# Patient Record
Sex: Male | Born: 1940 | Race: White | Hispanic: No | Marital: Married | Smoking: Never smoker
Health system: Southern US, Community
[De-identification: ages and names within clinical notes are randomized; demographics above are authoritative.]

## PROBLEM LIST (undated history)

## (undated) DIAGNOSIS — I10 Essential (primary) hypertension: Secondary | ICD-10-CM

## (undated) DIAGNOSIS — C439 Malignant melanoma of skin, unspecified: Secondary | ICD-10-CM

## (undated) DIAGNOSIS — E785 Hyperlipidemia, unspecified: Secondary | ICD-10-CM

## (undated) HISTORY — DX: Essential (primary) hypertension: I10

## (undated) HISTORY — PX: OTHER SURGICAL HISTORY: SHX169

## (undated) HISTORY — DX: Hyperlipidemia, unspecified: E78.5

---

## 2020-11-10 ENCOUNTER — Other Ambulatory Visit: Payer: Self-pay

## 2020-11-10 ENCOUNTER — Encounter (HOSPITAL_COMMUNITY): Payer: Self-pay

## 2020-11-10 ENCOUNTER — Emergency Department (HOSPITAL_COMMUNITY)
Admission: EM | Admit: 2020-11-10 | Discharge: 2020-11-10 | Disposition: A | Discharge status: Home / Self Care | Payer: Medicare Other | Attending: Emergency Medicine | Admitting: Emergency Medicine

## 2020-11-10 DIAGNOSIS — R030 Elevated blood-pressure reading, without diagnosis of hypertension: Secondary | ICD-10-CM | POA: Insufficient documentation

## 2020-11-10 DIAGNOSIS — Z5321 Procedure and treatment not carried out due to patient leaving prior to being seen by health care provider: Secondary | ICD-10-CM | POA: Insufficient documentation

## 2020-11-10 HISTORY — DX: Malignant melanoma of skin, unspecified: C43.9

## 2020-11-10 NOTE — ED Triage Notes (Signed)
Pt reports having a 99991111 systolic blood pressure prior to arrival and a minor headache. BP in triage was 162/88. Pt sts that's a much better reading and requests to follow up with primary physician. Left from triage.

## 2021-01-22 ENCOUNTER — Telehealth: Payer: Self-pay | Admitting: Oncology

## 2021-01-22 NOTE — Telephone Encounter (Signed)
Scheduled appt per 11/22 referral. Pt aware.

## 2021-02-13 ENCOUNTER — Other Ambulatory Visit: Payer: Self-pay

## 2021-02-13 ENCOUNTER — Inpatient Hospital Stay: Payer: Medicare Other | Attending: Oncology | Admitting: Oncology

## 2021-02-13 ENCOUNTER — Ambulatory Visit: Payer: Medicare Other | Admitting: Oncology

## 2021-02-13 VITALS — BP 130/61 | HR 65 | Temp 98.1°F | Resp 16 | Ht 67.0 in | Wt 171.0 lb

## 2021-02-13 DIAGNOSIS — C439 Malignant melanoma of skin, unspecified: Secondary | ICD-10-CM | POA: Insufficient documentation

## 2021-02-13 DIAGNOSIS — I10 Essential (primary) hypertension: Secondary | ICD-10-CM | POA: Insufficient documentation

## 2021-02-13 NOTE — Progress Notes (Signed)
Reason for the request:    Melanoma  HPI: I was asked by Dr. Renda Rolls  to evaluate Brandon Padilla for the evaluation of melanoma.  This 80 year old man relocated to this area recently from [facility].  He is a rather healthy gentleman with history of hypertension and recurrent skin cancer.  He had multiple Mohs surgery in the past.  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  He underwent localized excision and sentinel lymph node sampling and found to have stage II disease without any evidence of lymph node involvement at that time.  He has been on active surveillance since that time including full dermatology follow-up.  He relocated to this area to be closer to family and is establishing care.  He denies any recent complaints at this time.  He denies any constitutional symptoms or weight loss.  He does not report any headaches, blurry vision, syncope or seizures. Does not report any fevers, chills or sweats.  Does not report any cough, wheezing or hemoptysis.  Does not report any chest pain, palpitation, orthopnea or leg edema.  Does not report any nausea, vomiting or abdominal pain.  Does not report any constipation or diarrhea.  Does not report any skeletal complaints.    Does not report frequency, urgency or hematuria.  Does not report any skin rashes or lesions. Does not report any heat or cold intolerance.  Does not report any lymphadenopathy or petechiae.  Does not report any anxiety or depression.  Remaining review of systems is negative.     Past Medical History:  Diagnosis Date   Malignant melanoma ([place])   Hypertension    Social History   Socioeconomic History   Marital status: Married    Spouse name: Not on file   Number of children: Not on file   Years of education: Not on file   Highest education level: Not on file  Occupational History   Not on file  Tobacco Use   Smoking status: Never   Smokeless tobacco: Never  Substance and Sexual Activity   Alcohol  use: Not on file   Drug use: Not on file   Sexual activity: Not on file  Other Topics Concern   Not on file  Social History Narrative   Not on file   Social Determinants of Health   Financial Resource Strain: Not on file  Food Insecurity: Not on file  Transportation Needs: Not on file  Physical Activity: Not on file  Stress: Not on file  Social Connections: Not on file  Intimate Partner Violence: Not on file  :  Pertinent items are noted in HPI.  Exam: Blood pressure 130/61, pulse 65, temperature 98.1 F (36.7 C), temperature source Temporal, resp. rate 16, height _0  (1.702 m), weight 171 lb (77.6 kg), SpO2 99 %. ECOG 1 General appearance: alert and cooperative appeared without distress. Head: atraumatic without any abnormalities. Eyes: conjunctivae/corneas clear. PERRL.  Sclera anicteric. Throat: lips, mucosa, and tongue normal; without oral thrush or ulcers. Resp: clear to auscultation bilaterally without rhonchi, wheezes or dullness to percussion. Cardio: regular rate and rhythm, S1, S2 normal, no murmur, click, rub or gallop GI: soft, non-tender; bowel sounds normal; no masses,  no organomegaly Skin: Well-healed scar noted on his right forearm Lymph nodes: Cervical, supraclavicular, and axillary nodes normal. Neurologic: Grossly normal without any motor, sensory or deep tendon reflexes. Musculoskeletal: No joint deformity or effusion.    Assessment and Plan:   80 year old with:  1.  History of melanoma of the forearm diagnosed in in 2020.  He was found to have stage II disease after local wide excision and negative lymph node sampling.  The natural course of this disease was reviewed at this time and treatment options were reviewed.  Relapse rate were also assessed at this time.  Oral targeted therapy for BRAF if he harbors the appropriate mutation versus immunotherapy remains the mainstay for treatment of advanced melanoma.  Single agent versus immunotherapy  doublet would be considered at that time.  At this time, high risk of relapse remains low and I recommended to repeat imaging studies with 1 last time and suspend any imaging studies moving forward.  We will update his labs and physical examination in 6 months.  2.  Follow-up: Will be in 6 months after repeat evaluation.   45  minutes were dedicated to this visit. The time was spent on reviewing disease status, risk of relapse of assessment, discussing treatment options,  and answering questions regarding future plan.     A copy of this consult has been forwarded to the requesting physician.

## 2021-02-21 ENCOUNTER — Telehealth: Payer: Self-pay | Admitting: Oncology

## 2021-02-21 NOTE — Telephone Encounter (Signed)
Sch per 12/15 los, pt aware °

## 2021-03-05 ENCOUNTER — Ambulatory Visit: Payer: Medicare Other | Admitting: Family Medicine

## 2021-04-14 ENCOUNTER — Encounter: Payer: Self-pay | Admitting: General Practice

## 2021-04-14 ENCOUNTER — Inpatient Hospital Stay: Payer: Medicare Other | Attending: Oncology | Admitting: General Practice

## 2021-04-14 NOTE — Progress Notes (Signed)
[facility]ual [facility] Note  Met with Brandon Padilla, who goes by "Brandon Padilla," for assistance reviewing documents at [facility]. Brandon Tijerina determined that he would like to specify more details, including special instructions, on his forms and has registered for a follow-up appointment at [facility] on 2/24 to complete the paperwork.   [place], [place], [facility] Pager [phone] Voicemail [phone]

## 2021-04-15 NOTE — Progress Notes (Signed)
[facility]ual Care Note  Inadvertently created a second note for this encounter. Please see same-day note.   [place], [place], [facility] Pager [phone] Voicemail [phone]

## 2021-04-25 ENCOUNTER — Other Ambulatory Visit: Payer: Self-pay

## 2021-04-25 ENCOUNTER — Inpatient Hospital Stay: Payer: Medicare Other | Admitting: Licensed Clinical Social Worker

## 2021-04-25 DIAGNOSIS — C439 Malignant melanoma of skin, unspecified: Secondary | ICD-10-CM

## 2021-04-25 NOTE — Progress Notes (Signed)
[facility] Healthcare Advance Directives °Clinical Social Work ° °Patient presented to [facility]  to review and complete healthcare advance directives.  Clinical Social Worker met with patient and pt's spouse.  The patient designated James Ronald Clark as their primary healthcare agent and Douglas Robert Udall as their secondary agent.  Patient also completed healthcare living will.   ° °Documents were notarized and copies made for patient/family. Clinical Social Worker will send documents to medical records to be scanned into patient's chart. °Clinical Social Worker encouraged patient/family to contact with any additional questions or concerns. ° ° °MICHELLE E ZAVALA, LCSW °Clinical Social Worker °[facility]      ° °

## 2021-04-28 ENCOUNTER — Ambulatory Visit (INDEPENDENT_AMBULATORY_CARE_PROVIDER_SITE_OTHER): Payer: Medicare Other | Admitting: Podiatry

## 2021-04-28 ENCOUNTER — Ambulatory Visit (INDEPENDENT_AMBULATORY_CARE_PROVIDER_SITE_OTHER): Payer: Medicare Other

## 2021-04-28 ENCOUNTER — Other Ambulatory Visit: Payer: Self-pay

## 2021-04-28 DIAGNOSIS — M21619 Bunion of unspecified foot: Secondary | ICD-10-CM

## 2021-04-28 DIAGNOSIS — M778 Other enthesopathies, not elsewhere classified: Secondary | ICD-10-CM | POA: Diagnosis not present

## 2021-04-28 DIAGNOSIS — M205X1 Other deformities of toe(s) (acquired), right foot: Secondary | ICD-10-CM | POA: Diagnosis not present

## 2021-04-28 DIAGNOSIS — M779 Enthesopathy, unspecified: Secondary | ICD-10-CM | POA: Diagnosis not present

## 2021-04-28 DIAGNOSIS — M79671 Pain in right foot: Secondary | ICD-10-CM

## 2021-04-29 ENCOUNTER — Other Ambulatory Visit: Payer: Self-pay | Admitting: Podiatry

## 2021-04-29 DIAGNOSIS — M778 Other enthesopathies, not elsewhere classified: Secondary | ICD-10-CM

## 2021-05-04 NOTE — Progress Notes (Signed)
Subjective:   Patient ID: Brandon Padilla, male   DOB: 81 y.o.   MRN: [id number]   HPI 81 year old male presents the office today for concerns of his right foot how the big toe has been turning to the side.  He does like to walk about 3 miles a day has not allowing him to walk as much.  Does not cause significant pain.  He previously did have some swelling.  No recent treatment.  No recent injury.  He has no other concerns.   Review of Systems  All other systems reviewed and are negative.  Past Medical History:  Diagnosis Date   Malignant melanoma ([place])     No past surgical history on file.   Current Outpatient Medications:    atorvastatin (LIPITOR) 20 MG tablet, Take 20 mg by mouth at bedtime., Disp: , Rfl:    lisinopril-hydrochlorothiazide (ZESTORETIC) 10-12.5 MG tablet, Take 1 tablet by mouth daily., Disp: , Rfl:    niacinamide 500 MG tablet, Take 500 mg by mouth 2 (two) times daily., Disp: , Rfl:   No Known Allergies        Objective:  Physical Exam  General: AAO x3, NAD  Dermatological: Skin is warm, dry and supple bilateral.  There are no open sores, no preulcerative lesions, no rash or signs of infection present.  Vascular: Dorsalis Pedis artery and Posterior Tibial artery pedal pulses are 2/4 bilateral with immedate capillary fill time. There is no pain with calf compression, swelling, warmth, erythema.   Neruologic: Grossly intact via light touch bilateral.   Musculoskeletal: Moderate bunion is present on the right foot there is decreased range of motion of the first MPJ with dorsal spurring present at the first lipid today.  There is mild edema present to the joint there is no erythema or warmth.  No specific area of tenderness otherwise.  Muscular strength 5/5 in all groups tested bilateral.  Gait: Unassisted, Nonantalgic.       Assessment:   Right foot first MPJ arthritis, bunion     Plan:  -Treatment options discussed including all alternatives,  risks, and complications -Etiology of symptoms were discussed -X-rays were obtained and reviewed with the patient.  There is no acute fracture.  Bunion is present there is arthritic changes present the first MPJ with narrowing of the joint spaces as well as dorsal spurring. -Discussed with conservative as well as surgical options.  Conservatively discussed wearing shoes with stiffer sole and we can also consider doing orthotics.  Topical anti-inflammatories or steroid injection if needed.  Surgically would need to have a first MTPJ arthrodesis.  He is not interested in that at this time.  Trula Slade DPM

## 2021-07-11 ENCOUNTER — Emergency Department (HOSPITAL_COMMUNITY)
Admission: EM | Admit: 2021-07-11 | Discharge: 2021-07-11 | Disposition: A | Discharge status: Home / Self Care | Payer: Medicare Other | Attending: Emergency Medicine | Admitting: Emergency Medicine

## 2021-07-11 ENCOUNTER — Encounter (HOSPITAL_COMMUNITY): Payer: Self-pay

## 2021-07-11 ENCOUNTER — Other Ambulatory Visit: Payer: Self-pay

## 2021-07-11 DIAGNOSIS — H6122 Impacted cerumen, left ear: Secondary | ICD-10-CM | POA: Insufficient documentation

## 2021-07-11 DIAGNOSIS — H9202 Otalgia, left ear: Secondary | ICD-10-CM | POA: Diagnosis present

## 2021-07-11 MED ORDER — OFLOXACIN 0.3 % OT SOLN: 5.0000 [drp] | Freq: Two times a day (BID) | OTIC | 0 refills | Status: DC

## 2021-07-11 NOTE — Discharge Instructions (Addendum)
Please follow-up with ENT, call Dr. Iona Beard office to request appointment.  Recommend taking the eardrops as prescribed.  Come back to ER for increasing pain, fever or other new concerning symptom. ?

## 2021-07-11 NOTE — ED Provider Notes (Signed)
?[facility] DEPT ?Provider Note ? ? ?CSN: [id number] ?Arrival date & time: 07/11/21  0857 ? ?  ? ?History ? ?Chief Complaint  ?Patient presents with  ? Ear Pain  ? ? ?Brandon Padilla is a 81 y.o. male.  Presented to the emergency department due to concern for ruptured eardrum, impacted cerumen.  Patient reports that he went to his primary care doctor yesterday to have his left ear cleared.  He was concerned about decreased hearing, cerumen impaction.  States that while he was at the PCP office and RN was performing irrigation to attempt to clear out the impaction when he felt sudden onset of pain in his left ear, he was told that his eardrum ruptured and he was referred to ear nose and throat for further evaluation.  Reports ENT cannot see him until July.  States that pain is minimal at present, concerned about the status of his ear.  No fevers or chills. ? ?HPI ? ?  ? ?Home Medications ?Prior to Admission medications   ?Medication Sig Start Date End Date Taking? Authorizing Provider  ?ofloxacin (FLOXIN) 0.3 % OTIC solution Place 5 drops into the left ear 2 (two) times daily. 07/11/21  Yes Lucrezia Starch, MD  ?atorvastatin (LIPITOR) 20 MG tablet Take 20 mg by mouth at bedtime. 09/25/20   [provider]  ?lisinopril-hydrochlorothiazide (ZESTORETIC) 10-12.5 MG tablet Take 1 tablet by mouth daily.    [provider]  ?niacinamide 500 MG tablet Take 500 mg by mouth 2 (two) times daily. 10/17/20   [provider]  ?   ? ?Allergies    ?Patient has no known allergies.   ? ?Review of Systems   ?Review of Systems  ?HENT:  Positive for ear pain and hearing loss. Negative for ear discharge.   ?All other systems reviewed and are negative. ? ?Physical Exam ?Updated Vital Signs ?BP (!) 145/71 (BP Location: Left Arm)   Pulse 76   Temp 98 ?F (36.7 ?C) (Oral)   Resp 18   SpO2 97%  ?Physical Exam ?Vitals and nursing note reviewed.  ?Constitutional:   ?   General: He is  not in acute distress. ?   Appearance: He is well-developed.  ?HENT:  ?   Head: Normocephalic and atraumatic.  ?   Right Ear: Tympanic membrane, ear canal and external ear normal. There is no impacted cerumen.  ?   Ears:  ?   Comments: Left ear has significant cerumen impaction, unable to visualize TM readily due to the impaction ?Eyes:  ?   Conjunctiva/sclera: Conjunctivae normal.  ?Cardiovascular:  ?   Rate and Rhythm: Normal rate.  ?   Pulses: Normal pulses.  ?Pulmonary:  ?   Effort: Pulmonary effort is normal. No respiratory distress.  ?Abdominal:  ?   Palpations: Abdomen is soft.  ?   Tenderness: There is no abdominal tenderness.  ?Musculoskeletal:     ?   General: No swelling.  ?   Cervical back: Neck supple.  ?Skin: ?   General: Skin is warm and dry.  ?   Capillary Refill: Capillary refill takes less than 2 seconds.  ?Neurological:  ?   Mental Status: He is alert.  ?Psychiatric:     ?   Mood and Affect: Mood normal.  ? ? ?ED Results / Procedures / Treatments   ?Labs ?(all labs ordered are listed, but only abnormal results are displayed) ?Labs Reviewed - No data to display ? ?EKG ?None ? ?Radiology ?  No results found. ? ?Procedures ?Marland KitchenEar Cerumen Removal ? ?Date/Time: 07/11/2021 1:09 PM ?Performed by: Lucrezia Starch, MD ?Authorized by: Lucrezia Starch, MD  ? ?Consent:  ?  Consent obtained:  Verbal ?  Risks, benefits, and alternatives were discussed: yes   ?  Risks discussed:  Bleeding, dizziness, incomplete removal, infection, pain and TM perforation ?  Alternatives discussed:  No treatment, delayed treatment, alternative treatment, observation and referral ?Universal protocol:  ?  Immediately prior to procedure, a time out was called: yes   ?  Patient identity confirmed:  Verbally with patient ?Procedure details:  ?  Location:  L ear ?  Procedure type: curette   ?  Procedure outcomes: cerumen removed   ?Post-procedure details:  ?  Inspection:  Some cerumen remaining ?  Procedure completion:  Tolerated  well, no immediate complications  ? ? ?Medications Ordered in ED ?Medications - No data to display ? ?ED Course/ Medical Decision Making/ A&P ?  ?                        ?Medical Decision Making ?Risk ?Prescription drug management. ? ? ?81 year old male presented to ER for left ear evaluation.  Was at PCP yesterday due to concern for ear infection and while irrigating the ear he had sudden onset of pain in was told that his TM may have ruptured.  He was referred to ENT but does not have follow-up until a couple months.  Successfully removed significant amount of cerumen from his left ear.  He still however has a significant amount of cerumen in the left ear and still unable to readily visualize the tympanic membrane.  Will give Rx for otic drops in case of possible ruptured TM from yesterday.  Encouraged patient to call ENT office to request closer appointment.  Provided information for ENT on-call today.  Reviewed return precautions and discharged. ? ? ? ? ? ? ? ?Final Clinical Impression(s) / ED Diagnoses ?Final diagnoses:  ?Impacted cerumen of left ear  ? ? ?Rx / DC Orders ?ED Discharge Orders   ? ?      Ordered  ?  ofloxacin (FLOXIN) 0.3 % OTIC solution  2 times daily       ? 07/11/21 1006  ? ?  ?  ? ?  ? ? ?  ?Lucrezia Starch, MD ?07/11/21 1309 ? ?

## 2021-07-11 NOTE — ED Triage Notes (Signed)
Pt reports he went to PCP to have ear impaction cleared in left ear yesterday, but his ear drum ruptured. Pt is attempting to get hearing aids and wants to make sure there is not too much damage to his ear. ?

## 2021-07-22 ENCOUNTER — Ambulatory Visit (INDEPENDENT_AMBULATORY_CARE_PROVIDER_SITE_OTHER): Payer: Medicare Other | Admitting: Nurse Practitioner

## 2021-07-22 ENCOUNTER — Encounter: Payer: Self-pay | Admitting: Nurse Practitioner

## 2021-07-22 VITALS — BP 122/90 | HR 63 | Temp 98.0°F | Ht 67.0 in | Wt 174.0 lb

## 2021-07-22 DIAGNOSIS — Z6827 Body mass index (BMI) 27.0-27.9, adult: Secondary | ICD-10-CM

## 2021-07-22 DIAGNOSIS — H7292 Unspecified perforation of tympanic membrane, left ear: Secondary | ICD-10-CM

## 2021-07-22 DIAGNOSIS — C439 Malignant melanoma of skin, unspecified: Secondary | ICD-10-CM | POA: Diagnosis not present

## 2021-07-22 DIAGNOSIS — Z7689 Persons encountering health services in other specified circumstances: Secondary | ICD-10-CM

## 2021-07-22 DIAGNOSIS — I10 Essential (primary) hypertension: Secondary | ICD-10-CM

## 2021-07-22 LAB — POCT URINALYSIS DIPSTICK
Bilirubin, UA: NEGATIVE
Blood, UA: NEGATIVE
Glucose, UA: NEGATIVE
Ketones, UA: NEGATIVE
Leukocytes, UA: NEGATIVE
Nitrite, UA: NEGATIVE
Protein, UA: NEGATIVE
Spec Grav, UA: 1.02 (ref 1.010–1.025)
Urobilinogen, UA: 1 E.U./dL
pH, UA: 7 (ref 5.0–8.0)

## 2021-07-22 NOTE — Progress Notes (Signed)
I,Victoria T Hamilton,acting as a Education administrator for Minette Brine, FNP.,have documented all relevant documentation on the behalf of Minette Brine, FNP,as directed by  Minette Brine, FNP while in the presence of Minette Brine, [place].  This visit occurred during the SARS-CoV-2 public health emergency.  Safety protocols were in place, including screening questions prior to the visit, additional usage of staff PPE, and extensive cleaning of exam room while observing appropriate contact time as indicated for disinfecting solutions.  Subjective:     Patient ID: Brandon Padilla , male    DOB: Apr 09, 1940 , 80 y.o.   MRN: [id number]   Chief Complaint  Patient presents with   New Patient (Initial Visit)    HPI  Pt presents today to establish care. He relocated from [facility] less than one year.  His partner and he decided to relocate her to [place], after they arrived his partner had back pain and was diagnosed with metastatic prostate cancer. 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 No children.   PMH - hypertension (6 months taking lisinopril), cholesterol (lipitor - 4-5 years).   Had labs done at [facility] 2 weeks ago.  He had been wearing hearing aids and stopped but recently has been having difficulty hearing, went to [employer]. His left ear had increase cerumen. During his ear flushing and ended with a perforated tympanic membrane and he could taste the irrigation solution. Went to [employer] , was able to remove some cerumen and unable to see TM. He has been having "popping' and his hearing is worse. When listening at a low volume can hear clearly. He had pain intially and a headache.   He used to walk 3 miles in the morning but not much recently due to his husband being treated for metastatic cancer.   When he initially relocated here he was having headaches and checked his blood  pressure at [facility] was 403 systolic. Since lisinopril has been effective and feels like well   He reports having tympanic membrane ruptured 2 weeks ago in left ear. Previous practice, [facility]. He went to his pcp 2 weeks ago, he was not pleased with care after the incident of his eat which he admits is partially his fault & the nurses fault.  He would like for his left ear to be looked at here today, wanting to know how it is healing. He reports having an appointment with ENT a week from this Friday coming.  He recently moved here from [place] , he has been here since September of 2022.  He had cbc and metabolic panel done 3 weeks ago.       Past Medical History:  Diagnosis Date   Malignant melanoma ([place])      Family History  Problem Relation Age of Onset   Hypertension Mother    Dementia Mother    Heart attack Father    Diabetes Brother    Prostate cancer Brother      Current Outpatient Medications:    atorvastatin (LIPITOR) 20 MG tablet, Take 20 mg by mouth at bedtime., Disp: , Rfl:    lisinopril-hydrochlorothiazide (ZESTORETIC) 10-12.5 MG tablet, Take 1 tablet by mouth daily., Disp: , Rfl:    niacinamide 500 MG tablet, Take 500 mg by mouth 2 (two) times daily., Disp: , Rfl:    ofloxacin (FLOXIN) 0.3 % OTIC solution, Place  5 drops into the left ear 2 (two) times daily. (Patient not taking: Reported on 07/22/2021), Disp: 5 mL, Rfl: 0   No Known Allergies   Review of Systems  Constitutional: Negative.   HENT:  Positive for hearing loss (left ear). Negative for tinnitus.   Eyes: Negative.   Cardiovascular: Negative.   Gastrointestinal: Negative.   Genitourinary: Negative.   Musculoskeletal: Negative.   Skin: Negative.   Allergic/Immunologic: Negative.   Hematological: Negative.      Today's Vitals   07/22/21 1412  BP: 122/90  Pulse: 63  Temp: 98 F (36.7 C)  SpO2: 97%  Weight: 174 lb (78.9 kg)  Height: '5\' 7"'$  (1.702 m)  PainSc: 0-No pain   Body mass  index is 27.25 kg/m.  Wt Readings from Last 3 Encounters:  07/22/21 174 lb (78.9 kg)  02/13/21 171 lb (77.6 kg)  11/10/20 182 lb (82.6 kg)    Objective:  Physical Exam Vitals reviewed.  Constitutional:      General: He is not in acute distress.    Appearance: Normal appearance. He is obese.  HENT:     Head: Normocephalic.  Cardiovascular:     Rate and Rhythm: Normal rate and regular rhythm.     Pulses: Normal pulses.     Heart sounds: Normal heart sounds. No murmur heard. Pulmonary:     Effort: Pulmonary effort is normal. No respiratory distress.     Breath sounds: Normal breath sounds. No wheezing.  Musculoskeletal:        General: Normal range of motion.  Skin:    General: Skin is warm and dry.     Capillary Refill: Capillary refill takes less than 2 seconds.  Neurological:     General: No focal deficit present.     Mental Status: He is alert and oriented to person, place, and time.  Psychiatric:        Mood and Affect: Mood normal.        Behavior: Behavior normal.        Thought Content: Thought content normal.        Judgment: Judgment normal.         Assessment And Plan:     1. Hypertension, unspecified type Comments: Blood pressure is fairly controlled, continue current medications - POCT Urinalysis Dipstick (81002)  2. Perforation of left tympanic membrane Comments: Dried blood seen to ear canal, he is to see ENT one week from Friday. Advised to not insert anything to ear.   3. Melanoma of skin ([place]) Comments: Has appt at end of next month for 6 month f/u  4. Body mass index (BMI) of 27.0 to 27.9 in adult  5. Encounter to establish care with new doctor     Patient was given opportunity to ask questions. Patient verbalized understanding of the plan and was able to repeat key elements of the plan. All questions were answered to their satisfaction.  Minette Brine, FNP   I, Minette Brine, FNP, have reviewed all documentation for this visit. The  documentation on 07/22/21 for the exam, diagnosis, procedures, and orders are all accurate and complete.   IF YOU HAVE BEEN REFERRED TO A SPECIALIST, IT MAY TAKE 1-2 WEEKS TO SCHEDULE/PROCESS THE REFERRAL. IF YOU HAVE NOT HEARD FROM US/SPECIALIST IN TWO WEEKS, PLEASE GIVE [place] A CALL AT [phone].   THE PATIENT IS ENCOURAGED TO PRACTICE SOCIAL DISTANCING DUE TO THE COVID-19 PANDEMIC.

## 2021-08-08 ENCOUNTER — Inpatient Hospital Stay: Payer: Medicare Other | Attending: Oncology

## 2021-08-08 ENCOUNTER — Other Ambulatory Visit: Payer: Self-pay

## 2021-08-08 DIAGNOSIS — Z85820 Personal history of malignant melanoma of skin: Secondary | ICD-10-CM | POA: Diagnosis present

## 2021-08-08 DIAGNOSIS — C439 Malignant melanoma of skin, unspecified: Secondary | ICD-10-CM

## 2021-08-08 LAB — CMP (CANCER CENTER ONLY)
ALT: 20 U/L (ref 0–44)
AST: 17 U/L (ref 15–41)
Albumin: 4.4 g/dL (ref 3.5–5.0)
Alkaline Phosphatase: 43 U/L (ref 38–126)
Anion gap: 6 (ref 5–15)
BUN: 17 mg/dL (ref 8–23)
CO2: 28 mmol/L (ref 22–32)
Calcium: 9.9 mg/dL (ref 8.9–10.3)
Chloride: 105 mmol/L (ref 98–111)
Creatinine: 0.94 mg/dL (ref 0.61–1.24)
GFR, Estimated: >60 mL/min (ref >60)
Glucose, Bld: 108 mg/dL — ABNORMAL HIGH (ref 70–99)
Potassium: 4.6 mmol/L (ref 3.5–5.1)
Sodium: 139 mmol/L (ref 135–145)
Total Bilirubin: 1 mg/dL (ref 0.3–1.2)
Total Protein: 7.2 g/dL (ref 6.5–8.1)

## 2021-08-08 LAB — CBC WITH DIFFERENTIAL (CANCER CENTER ONLY)
Abs Immature Granulocytes: 0.02 10*3/uL (ref 0.00–0.07)
Basophils Absolute: 0 10*3/uL (ref 0.0–0.1)
Basophils Relative: 1 %
Eosinophils Absolute: 0 10*3/uL (ref 0.0–0.5)
Eosinophils Relative: 0 %
HCT: 38.9 % — ABNORMAL LOW (ref 39.0–52.0)
Hemoglobin: 13.8 g/dL (ref 13.0–17.0)
Immature Granulocytes: 0 %
Lymphocytes Relative: 24 %
Lymphs Abs: 1.3 10*3/uL (ref 0.7–4.0)
MCH: 32.5 pg (ref 26.0–34.0)
MCHC: 35.5 g/dL (ref 30.0–36.0)
MCV: 91.5 fL (ref 80.0–100.0)
Monocytes Absolute: 0.4 10*3/uL (ref 0.1–1.0)
Monocytes Relative: 8 %
Neutro Abs: 3.6 10*3/uL (ref 1.7–7.7)
Neutrophils Relative %: 67 %
Platelet Count: 209 10*3/uL (ref 150–400)
RBC: 4.25 MIL/uL (ref 4.22–5.81)
RDW: 11.8 % (ref 11.5–15.5)
WBC Count: 5.4 10*3/uL (ref 4.0–10.5)
nRBC: 0 % (ref 0.0–0.2)

## 2021-08-12 ENCOUNTER — Ambulatory Visit (HOSPITAL_COMMUNITY)
Admission: RE | Admit: 2021-08-12 | Discharge: 2021-08-12 | Disposition: A | Discharge status: Home / Self Care | Payer: Medicare Other | Source: Ambulatory Visit | Attending: Oncology | Admitting: Oncology

## 2021-08-12 DIAGNOSIS — C439 Malignant melanoma of skin, unspecified: Secondary | ICD-10-CM | POA: Insufficient documentation

## 2021-08-12 MED ORDER — IOHEXOL 300 MG/ML  SOLN
100.0000 mL | Freq: Once | INTRAMUSCULAR | Status: AC | PRN
  Administered 2021-08-12: 100 mL via INTRAVENOUS

## 2021-08-12 MED ORDER — SODIUM CHLORIDE (PF) 0.9 % IJ SOLN
INTRAMUSCULAR | Status: AC
  Filled 2021-08-12: qty 50

## 2021-08-15 ENCOUNTER — Inpatient Hospital Stay (HOSPITAL_BASED_OUTPATIENT_CLINIC_OR_DEPARTMENT_OTHER): Payer: Medicare Other | Admitting: Oncology

## 2021-08-15 ENCOUNTER — Other Ambulatory Visit: Payer: Self-pay

## 2021-08-15 VITALS — BP 131/65 | HR 66 | Temp 97.8°F | Resp 16 | Ht 67.0 in | Wt 174.3 lb

## 2021-08-15 DIAGNOSIS — C439 Malignant melanoma of skin, unspecified: Secondary | ICD-10-CM | POA: Diagnosis not present

## 2021-08-15 DIAGNOSIS — Z85820 Personal history of malignant melanoma of skin: Secondary | ICD-10-CM | POA: Diagnosis not present

## 2021-08-15 NOTE — Progress Notes (Signed)
Hematology and Oncology Follow Up Visit  Brandon Padilla [id number] 1940-09-17 81 y.o. 08/15/2021 11:44 AM Brandon Padilla, FNPNo ref. provider found   Principle Diagnosis: 81 year old man with stage II melanoma of the forearm diagnosed in 2020.  He is currently on active surveillance.   Prior Therapy: He is status post wide excision and sentinel lymph node sampling completed in 2020 while living in [place].  Current therapy: Active surveillance.  Interim History: [name] returns today for a follow-up visit.  Since last visit, he reports feeling well without any major complaints.  He denies any nausea, vomiting or abdominal pain.  He denies any skin rashes or lesions.  His performance status quality of life remained excellent.    Medications: I have reviewed the patient's current medications.  Current Outpatient Medications  Medication Sig Dispense Refill   atorvastatin (LIPITOR) 20 MG tablet Take 20 mg by mouth at bedtime.     lisinopril-hydrochlorothiazide (ZESTORETIC) 10-12.5 MG tablet Take 1 tablet by mouth daily.     niacinamide 500 MG tablet Take 500 mg by mouth 2 (two) times daily.     ofloxacin (FLOXIN) 0.3 % OTIC solution Place 5 drops into the left ear 2 (two) times daily. (Patient not taking: Reported on 07/22/2021) 5 mL 0   No current facility-administered medications for this visit.     Allergies: No Known Allergies    Physical Exam: Blood pressure 131/65, pulse 66, temperature 97.8 F (36.6 C), temperature source Temporal, resp. rate 16, height '5\' 7"'$  (1.702 m), weight 174 lb 4.8 oz (79.1 kg), SpO2 100 %.  ECOG: 0    General appearance: Comfortable appearing without any discomfort Head: Normocephalic without any trauma Oropharynx: Mucous membranes are moist and pink without any thrush or ulcers. Eyes: Pupils are equal and round reactive to light. Lymph nodes: No cervical, supraclavicular, inguinal or axillary lymphadenopathy.   Heart:regular rate and  rhythm.  S1 and S2 without leg edema. Lung: Clear without any rhonchi or wheezes.  No dullness to percussion. Abdomin: Soft, nontender, nondistended with good bowel sounds.  No hepatosplenomegaly. Musculoskeletal: No joint deformity or effusion.  Full range of motion noted. Neurological: No deficits noted on motor, sensory and deep tendon reflex exam. Skin: No petechial rash or dryness.  Appeared moist.      Lab Results: Lab Results  Component Value Date   WBC 5.4 08/08/2021   HGB 13.8 08/08/2021   HCT 38.9 (L) 08/08/2021   MCV 91.5 08/08/2021   PLT 209 08/08/2021     Chemistry      Component Value Date/Time   NA 139 08/08/2021 1208   K 4.6 08/08/2021 1208   CL 105 08/08/2021 1208   CO2 28 08/08/2021 1208   BUN 17 08/08/2021 1208   CREATININE 0.94 08/08/2021 1208      Component Value Date/Time   CALCIUM 9.9 08/08/2021 1208   ALKPHOS 43 08/08/2021 1208   AST 17 08/08/2021 1208   ALT 20 08/08/2021 1208   BILITOT 1.0 08/08/2021 1208       Radiological Studies: IMPRESSION: 1. No specific findings of active malignancy in the chest, abdomen, or pelvis. 2. A triangular 4 by 3 mm nodule in the right middle lobe is highly likely to benign based on morphology and size, although Fleischner follow up criteria do not apply given the patient's history of malignancy. If clinically warranted, surveillance CT in 1 years time could be utilized; if patient has prior imaging workup which can be provided, we are happy  to compare to assess for stability. 3. Other imaging findings of potential clinical significance: Aortic Atherosclerosis (ICD10-I70.0). 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 Sigmoid colon diverticulosis. Prostatomegaly. Mild to moderate degrees of lower lumbar impingement.   Impression and Plan:    81 year old with:  1.  Stage II melanoma of the forearm diagnosed in in 2020.  He had completed wide  excision and sentinel lymph node sampling without any additional treatment.  The natural course of this disease was reviewed at this time.  CT scan obtained on August 12, 2021 was personally reviewed and showed no evidence of metastatic disease.  At this time, I see no indication for treatment unless he has relapsed disease.  We will continue to offer medical oncology surveillance we will obtain imaging studies in the future as needed.  2.  Dermatology surveillance: He is up-to-date and I recommended continuing indefinitely.    3.  Follow-up: He will return in 1 year for repeat evaluation.     30  minutes were spent on this encounter.  The time was dedicated to reviewing laboratory data, imaging studies, disease status update and outlining future plan of care discussion.    Zola Button, MD 6/16/202311:44 AM

## 2021-09-24 ENCOUNTER — Telehealth: Payer: Self-pay | Admitting: Oncology

## 2021-09-24 NOTE — Telephone Encounter (Signed)
Called patient regarding upcoming 2024 appointment, left a voicemail. 

## 2021-11-24 ENCOUNTER — Encounter: Payer: Self-pay | Admitting: Nurse Practitioner

## 2021-11-24 ENCOUNTER — Ambulatory Visit (INDEPENDENT_AMBULATORY_CARE_PROVIDER_SITE_OTHER): Payer: Medicare Other | Admitting: Nurse Practitioner

## 2021-11-24 VITALS — BP 122/68 | HR 63 | Temp 98.6°F | Ht 67.0 in | Wt 174.6 lb

## 2021-11-24 DIAGNOSIS — I10 Essential (primary) hypertension: Secondary | ICD-10-CM

## 2021-11-24 DIAGNOSIS — R351 Nocturia: Secondary | ICD-10-CM

## 2021-11-24 DIAGNOSIS — I700 Atherosclerosis of aorta: Secondary | ICD-10-CM | POA: Diagnosis not present

## 2021-11-24 DIAGNOSIS — R21 Rash and other nonspecific skin eruption: Secondary | ICD-10-CM | POA: Diagnosis not present

## 2021-11-24 DIAGNOSIS — C439 Malignant melanoma of skin, unspecified: Secondary | ICD-10-CM

## 2021-11-24 DIAGNOSIS — Z0000 Encounter for general adult medical examination without abnormal findings: Secondary | ICD-10-CM

## 2021-11-24 DIAGNOSIS — Z872 Personal history of diseases of the skin and subcutaneous tissue: Secondary | ICD-10-CM

## 2021-11-24 DIAGNOSIS — N401 Enlarged prostate with lower urinary tract symptoms: Secondary | ICD-10-CM

## 2021-11-24 DIAGNOSIS — E782 Mixed hyperlipidemia: Secondary | ICD-10-CM

## 2021-11-24 MED ORDER — AMLODIPINE BESYLATE 2.5 MG PO TABS: 2.5000 mg | ORAL_TABLET | Freq: Every day | ORAL | 2 refills | Status: DC

## 2021-11-24 MED ORDER — CLOBETASOL PROPIONATE 0.05 % EX CREA
1.0000 | TOPICAL_CREAM | Freq: Two times a day (BID) | CUTANEOUS | 2 refills | Status: AC
Start: 2021-11-24 — End: ?

## 2021-11-24 NOTE — Progress Notes (Signed)
Subjective:   Brandon Padilla is a 81 y.o. male who presents for an Initial Medicare Annual Wellness Visit.  He is now wearing bilateral hearing aids. He has been to an eye doctor had cataracts. Eats healthy most times. Do not eat large portions, typically will split his entree and that is enough for him. He has a CT of lung yearly to check for any residual from myeloma. The most recent one he had shows 4 mm nodule.   Left medial thigh with red eczema type rash using clobetasol   Review of Systems  Constitutional: Negative.   HENT: Negative.  Negative for hearing loss.   Eyes: Negative.   Respiratory: Negative.    Cardiovascular: Negative.   Gastrointestinal: Negative.   Genitourinary: Negative.   Musculoskeletal: Negative.   Skin: Negative.   Neurological: Negative.   Endo/Heme/Allergies: Negative.   Psychiatric/Behavioral: Negative.         Objective:    Today's Vitals   11/24/21 1443  BP: 122/68  Pulse: 63  Temp: 98.6 F (37 C)  TempSrc: Oral  Weight: 174 lb 9.6 oz (79.2 kg)  Height: _0  (1.702 m)   Body mass index is 27.35 kg/m. Physical Exam Vitals reviewed.  Constitutional:      General: He is not in acute distress.    Appearance: Normal appearance.  Eyes:     Pupils: Pupils are equal, round, and reactive to light.  Cardiovascular:     Rate and Rhythm: Normal rate and regular rhythm.     Pulses: Normal pulses.     Heart sounds: Normal heart sounds.  Pulmonary:     Effort: Pulmonary effort is normal. No respiratory distress.     Breath sounds: Normal breath sounds.  Musculoskeletal:        General: Normal range of motion.  Skin:    General: Skin is warm and dry.     Capillary Refill: Capillary refill takes less than 2 seconds.  Neurological:     General: No focal deficit present.     Mental Status: He is alert and oriented to person, place, and time.     Cranial Nerves: No cranial nerve deficit.  Psychiatric:        Mood and Affect: Mood  normal.        Behavior: Behavior normal.        Thought Content: Thought content normal.        Judgment: Judgment normal.        07/11/2021    9:19 AM 02/13/2021   11:09 AM 11/10/2020    8:01 PM  Advanced Directives  Does Patient Have a Medical Advance Directive? No Yes No  Type of Advance Directive  Living will   Would patient like information on creating a medical advance directive? No - Patient declined  No - Patient declined    Current Medications (verified) Outpatient Encounter Medications as of 11/24/2021  Medication Sig   atorvastatin (LIPITOR) 20 MG tablet Take 20 mg by mouth at bedtime.   lisinopril-hydrochlorothiazide (ZESTORETIC) 10-12.5 MG tablet Take 1 tablet by mouth daily.   niacinamide 500 MG tablet Take 500 mg by mouth 2 (two) times daily.   ofloxacin (FLOXIN) 0.3 % OTIC solution Place 5 drops into the left ear 2 (two) times daily. (Patient not taking: Reported on 07/22/2021)   No facility-administered encounter medications on file as of 11/24/2021.    Allergies (verified) Patient has no known allergies.   History: Past Medical History:  Diagnosis Date  Malignant melanoma ([place])    Past Surgical History:  Procedure Laterality Date   excision Right    excision of melanoma   left foot surgery Left    Family History  Problem Relation Age of Onset   Hypertension Mother    Dementia Mother    Heart attack Father    Diabetes Brother    Prostate cancer Brother    Social History   Socioeconomic History   Marital status: Married    Spouse name: Not on file   Number of children: Not on file   Years of education: Not on file   Highest education level: Not on file  Occupational History   Occupation: retired  Tobacco Use   Smoking status: Never   Smokeless tobacco: Never  Substance and Sexual Activity   Alcohol use: Yes    Comment: socially he drinks wine   Drug use: Not Currently   Sexual activity: Yes    Birth control/protection: None  Other  Topics Concern   Not on file  Social History Narrative   Not on file   Social Determinants of Health   Financial Resource Strain: Low Risk  (11/24/2021)   Overall Financial Resource Strain (CARDIA)    Difficulty of Paying Living Expenses: Not hard at all  Food Insecurity: No Food Insecurity (11/24/2021)   Hunger Vital Sign    Worried About Running Out of Food in the Last Year: Never true    [place] in the Last Year: Never true  Transportation Needs: No Transportation Needs (11/24/2021)   PRAPARE - Hydrologist (Medical): No    Lack of Transportation (Non-Medical): No  Physical Activity: Sufficiently Active (11/24/2021)   Exercise Vital Sign    Days of Exercise per Week: 7 days    Minutes of Exercise per Session: 120 min  Stress: No Stress Concern Present (11/24/2021)   [place]    Feeling of Stress : Not at all  Social Connections: Moderately Isolated (11/24/2021)   Social Connection and Isolation Panel [NHANES]    Frequency of Communication with Friends and Family: More than three times a week    Frequency of Social Gatherings with Friends and Family: Once a week    Attends Religious Services: Never    Marine scientist or Organizations: No    Attends Archivist Meetings: Never    Marital Status: Married    Tobacco Counseling Counseling given: Not Answered   Clinical Intake:                 Diabetic?no         Activities of Daily Living    11/24/2021    2:53 PM 07/22/2021    3:05 PM  In your present state of health, do you have any difficulty performing the following activities:  Hearing? 1 1  Vision? 0 0  Difficulty concentrating or making decisions? 0 0  Walking or climbing stairs? 0 0  Dressing or bathing? 0 0  Doing errands, shopping? 0 0  Preparing Food and eating ? N   Using the Toilet? N   In the past six months, have you  accidently leaked urine? N   Do you have problems with loss of bowel control? N   Managing your Medications? N   Managing your Finances? N   Housekeeping or managing your Housekeeping? N     Patient Care Team: Minette Brine, FNP  as PCP - General (General Practice)  Indicate any recent Medical Services you may have received from other than [facility] providers in the past year (date may be approximate).     Assessment:   This is a routine wellness examination for Zacheriah.  Hearing/Vision screen No results found.  Dietary issues and exercise activities discussed:     Goals Addressed             This Visit's Progress    learn more about palliative care.        Depression Screen    11/24/2021    2:53 PM 11/24/2021    2:52 PM 07/22/2021    3:07 PM 07/22/2021    2:13 PM  PHQ 2/9 Scores  PHQ - 2 Score 0 0 0 0    Fall Risk    11/24/2021    2:53 PM 07/22/2021    3:05 PM  Fall Risk   Falls in the past year? 0 0  Number falls in past yr: 0   Injury with Fall? 0   Risk for fall due to : No Fall Risks   Follow up Falls evaluation completed     [place]:  Any stairs in or around the home? Yes  If so, are there any without handrails? Yes  Home free of loose throw rugs in walkways, pet beds, electrical cords, etc? Yes  Adequate lighting in your home to reduce risk of falls? Yes   ASSISTIVE DEVICES UTILIZED TO PREVENT FALLS:  Life alert? Yes  Use of a cane, walker or w/c? Yes  Grab bars in the bathroom? No  Shower chair or bench in shower? No  Elevated toilet seat or a handicapped toilet? No  Gait steady and fast without use of assistive device  Cognitive Function:        11/24/2021    2:55 PM  6CIT Screen  What Year? 0 points  What month? 0 points  What time? 0 points  Count back from 20 0 points  Months in reverse 0 points  Repeat phrase 0 points  Total Score 0 points    Immunizations Immunization History  Administered  Date(s) Administered   Influenza-Unspecified 12/03/2020   Pfizer Covid-19 Vaccine Bivalent Booster 36yr & up 03/22/2021    TDAP status: Up to date  Flu Vaccine status: Declined, Education has been provided regarding the importance of this vaccine but patient still declined. Advised may receive this vaccine at local pharmacy or Health Dept. Aware to provide a copy of the vaccination record if obtained from local pharmacy or Health Dept. Verbalized acceptance and understanding.  Pneumococcal vaccine status: Up to date  Covid-19 vaccine status: Completed vaccines  Qualifies for Shingles Vaccine? Yes   Zostavax completed No   Shingrix Completed?: No.    Education has been provided regarding the importance of this vaccine. Patient has been advised to call insurance company to determine out of pocket expense if they have not yet received this vaccine. Advised may also receive vaccine at local pharmacy or Health Dept. Verbalized acceptance and understanding.  Screening Tests Health Maintenance  Topic Date Due   TETANUS/TDAP  Never done   Zoster Vaccines- Shingrix (1 of 2) Never done   Pneumonia Vaccine 79 Years old (1 - PCV) Never done   COVID-19 Vaccine (2 - Pfizer risk series) 04/12/2021   INFLUENZA VACCINE  09/30/2021   Hepatitis C Screening  07/23/2022 (Originally 09/05/1958)   HPV VACCINES  Aged Out  Health Maintenance  Health Maintenance Due  Topic Date Due   TETANUS/TDAP  Never done   Zoster Vaccines- Shingrix (1 of 2) Never done   Pneumonia Vaccine 31+ Years old (1 - PCV) Never done   COVID-19 Vaccine (2 - Pfizer risk series) 04/12/2021   INFLUENZA VACCINE  09/30/2021     Lung Cancer Screening: (Low Dose CT Chest recommended if Age 16-80 years, 30 pack-year currently smoking OR have quit w/in 15years.) does not qualify.   Lung Cancer Screening Referral: na  Additional Screening:  Hepatitis C Screening: does qualify; Completed yes  Vision Screening: Recommended  annual ophthalmology exams for early detection of glaucoma and other disorders of the eye. Is the patient up to date with their annual eye exam?  Yes  Who is the provider or what is the name of the office in which the patient attends annual eye exams? [facility] If pt is not established with a provider, would they like to be referred to a provider to establish care?  established .   Dental Screening: Recommended annual dental exams for proper oral hygiene  Community Resource Referral / Chronic Care Management: CRR required this visit?  No   CCM required this visit?  No      Plan:    1. Encounter for Medicare annual wellness exam Pt's annual wellness exam was performed and geriatric assessment reviewed.  Pt has no new identiafble wellness concerns at this time.  WIll obtain routine labs.  Will obtain UA and micro.  Behavior modifications discussed and diet history reviewed. Pt will continue to exercise regularly and modify diet, with low GI, plant based foods and decrease food intake of processed foods.  Recommend intake of daily multivitamin, Vitamin D, and calcium. Recommond for preventive screenings, as well as recommend immunizations that include influenza (up to date) and TDAP (will check his records for tdap). Advised can get newest covid vaccine at pharmacy.   2. Essential hypertension Blood pressure is well controlled, continue current medications.  - amLODipine (NORVASC) 2.5 MG tablet; Take 1 tablet (2.5 mg total) by mouth daily. In evening  Dispense: 30 tablet; Refill: 2 - CMP14+EGFR  3. Melanoma of skin ([place]) Continue follow up with Oncology  4. Scaly patch rash  - clobetasol cream (TEMOVATE) 0.05 %; Apply 1 Application topically 2 (two) times daily.  Dispense: 60 g; Refill: 2  5. History of eczema  - clobetasol cream (TEMOVATE) 0.05 %; Apply 1 Application topically 2 (two) times daily.  Dispense: 60 g; Refill: 2  6. Mixed hyperlipidemia Chronic, controlled Continue  with current medications - Lipid panel  7. Benign prostatic hyperplasia with nocturia He would like his PSA checked today  - PSA  8. Aortic atherosclerosis (HCC) Continue statin, tolerating well  I have personally reviewed and noted the following in the patient's chart:   Medical and social history Use of alcohol, tobacco or illicit drugs  Current medications and supplements including opioid prescriptions. Patient is not currently taking opioid prescriptions. Functional ability and status Nutritional status Physical activity Advanced directives List of other physicians Hospitalizations, surgeries, and ER visits in previous 12 months Vitals Screenings to include cognitive, depression, and falls Referrals and appointments  In addition, I have reviewed and discussed with patient certain preventive protocols, quality metrics, and best practice recommendations. A written personalized care plan for preventive services as well as general preventive health recommendations were provided to patient.     Octavio Manns   11/24/2021   Nurse Notes:

## 2021-11-24 NOTE — Patient Instructions (Addendum)
Health Maintenance, Male Adopting a healthy lifestyle and getting preventive care are important in promoting health and wellness. Ask your health care provider about: The right schedule for you to have regular tests and exams. Things you can do on your own to prevent diseases and keep yourself healthy. What should I know about diet, weight, and exercise? Eat a healthy diet  Eat a diet that includes plenty of vegetables, fruits, low-fat dairy products, and lean protein. Do not eat a lot of foods that are high in solid fats, added sugars, or sodium. Maintain a healthy weight Body mass index (BMI) is a measurement that can be used to identify possible weight problems. It estimates body fat based on height and weight. Your health care provider can help determine your BMI and help you achieve or maintain a healthy weight. Get regular exercise Get regular exercise. This is one of the most important things you can do for your health. Most adults should: Exercise for at least 150 minutes each week. The exercise should increase your heart rate and make you sweat (moderate-intensity exercise). Do strengthening exercises at least twice a week. This is in addition to the moderate-intensity exercise. Spend less time sitting. Even light physical activity can be beneficial. Watch cholesterol and blood lipids Have your blood tested for lipids and cholesterol at 79 years of age, then have this test every 5 years. You may need to have your cholesterol levels checked more often if: Your lipid or cholesterol levels are high. You are older than 79 years of age. You are at high risk for heart disease. What should I know about cancer screening? Many types of cancers can be detected early and may often be prevented. Depending on your health history and family history, you may need to have cancer screening at various ages. This may include screening for: Colorectal cancer. Prostate cancer. Skin cancer. Lung  cancer. What should I know about heart disease, diabetes, and high blood pressure? Blood pressure and heart disease High blood pressure causes heart disease and increases the risk of stroke. This is more likely to develop in people who have high blood pressure readings or are overweight. Talk with your health care provider about your target blood pressure readings. Have your blood pressure checked: Every 3-5 years if you are 18-39 years of age. Every year if you are 40 years old or older. If you are between the ages of 65 and 75 and are a current or former smoker, ask your health care provider if you should have a one-time screening for abdominal aortic aneurysm (AAA). Diabetes Have regular diabetes screenings. This checks your fasting blood sugar level. Have the screening done: Once every three years after age 45 if you are at a normal weight and have a low risk for diabetes. More often and at a younger age if you are overweight or have a high risk for diabetes. What should I know about preventing infection? Hepatitis B If you have a higher risk for hepatitis B, you should be screened for this virus. Talk with your health care provider to find out if you are at risk for hepatitis B infection. Hepatitis C Blood testing is recommended for: Everyone born from 1945 through 1965. Anyone with known risk factors for hepatitis C. Sexually transmitted infections (STIs) You should be screened each year for STIs, including gonorrhea and chlamydia, if: You are sexually active and are younger than 79 years of age. You are older than 79 years of age and your   health care provider tells you that you are at risk for this type of infection. Your sexual activity has changed since you were last screened, and you are at increased risk for chlamydia or gonorrhea. Ask your health care provider if you are at risk. Ask your health care provider about whether you are at high risk for HIV. Your health care provider  may recommend a prescription medicine to help prevent HIV infection. If you choose to take medicine to prevent HIV, you should first get tested for HIV. You should then be tested every 3 months for as long as you are taking the medicine. Follow these instructions at home: Alcohol use Do not drink alcohol if your health care provider tells you not to drink. If you drink alcohol: Limit how much you have to 0-2 drinks a day. Know how much alcohol is in your drink. In the [place] one drink equals one 12 oz bottle of beer (355 mL), one 5 oz glass of wine (148 mL), or one 1 oz glass of hard liquor (44 mL). Lifestyle Do not use any products that contain nicotine or tobacco. These products include cigarettes, chewing tobacco, and vaping devices, such as e-cigarettes. If you need help quitting, ask your health care provider. Do not use street drugs. Do not share needles. Ask your health care provider for help if you need support or information about quitting drugs. General instructions Schedule regular health, dental, and eye exams. Stay current with your vaccines. Tell your health care provider if: You often feel depressed. You have ever been abused or do not feel safe at home. Summary Adopting a healthy lifestyle and getting preventive care are important in promoting health and wellness. Follow your health care provider's instructions about healthy diet, exercising, and getting tested or screened for diseases. Follow your health care provider's instructions on monitoring your cholesterol and blood pressure. This information is not intended to replace advice given to you by your health care provider. Make sure you discuss any questions you have with your health care provider. Document Revised: 07/08/2020 Document Reviewed: 07/08/2020 Elsevier Patient Education  [place].   Brandon Padilla , Thank you for taking time to come for your Medicare Wellness Visit. I appreciate your ongoing  commitment to your health goals. Please review the following plan we discussed and let me know if I can assist you in the future.   These are the goals we discussed:  Goals      learn more about palliative care.     Weight (lb) < 200 lb (90.7 kg)     " I would like to lose a little weight" - already walking daily and wheelbarrow.        This is a list of the screening recommended for you and due dates:  Health Maintenance  Topic Date Due   Tetanus Vaccine  Never done   Zoster (Shingles) Vaccine (1 of 2) Never done   Pneumonia Vaccine (1 - PCV) Never done   COVID-19 Vaccine (2 - Pfizer risk series) 04/12/2021   Flu Shot  09/30/2021   Hepatitis C Screening: USPSTF Recommendation to screen - Ages 18-79 yo.  07/23/2022*   HPV Vaccine  Aged Out  *Topic was postponed. The date shown is not the original due date.

## 2021-11-25 LAB — LIPID PANEL
Chol/HDL Ratio: 3.2 ratio (ref 0.0–5.0)
Cholesterol, Total: 178 mg/dL (ref 100–199)
HDL: 56 mg/dL (ref >39)
LDL Chol Calc (NIH): 91 mg/dL (ref 0–99)
Triglycerides: 183 mg/dL — ABNORMAL HIGH (ref 0–149)
VLDL Cholesterol Cal: 31 mg/dL (ref 5–40)

## 2021-11-25 LAB — CMP14+EGFR
ALT: 20 IU/L (ref 0–44)
AST: 21 IU/L (ref 0–40)
Albumin/Globulin Ratio: 1.6 (ref 1.2–2.2)
Albumin: 4.4 g/dL (ref 3.8–4.8)
Alkaline Phosphatase: 58 IU/L (ref 44–121)
BUN/Creatinine Ratio: 15 (ref 10–24)
BUN: 14 mg/dL (ref 8–27)
Bilirubin Total: 0.7 mg/dL (ref 0.0–1.2)
CO2: 22 mmol/L (ref 20–29)
Calcium: 9.3 mg/dL (ref 8.6–10.2)
Chloride: 102 mmol/L (ref 96–106)
Creatinine, Ser: 0.95 mg/dL (ref 0.76–1.27)
Globulin, Total: 2.7 g/dL (ref 1.5–4.5)
Glucose: 90 mg/dL (ref 70–99)
Potassium: 4.1 mmol/L (ref 3.5–5.2)
Sodium: 143 mmol/L (ref 134–144)
Total Protein: 7.1 g/dL (ref 6.0–8.5)
eGFR: 81 mL/min/{1.73_m2} (ref >59)

## 2021-11-25 LAB — PSA: Prostate Specific Ag, Serum: 1.4 ng/mL (ref 0.0–4.0)

## 2021-12-16 ENCOUNTER — Ambulatory Visit: Payer: Medicare Other

## 2021-12-16 ENCOUNTER — Other Ambulatory Visit: Payer: Self-pay

## 2021-12-16 VITALS — BP 138/68 | HR 59 | Temp 98.1°F | Ht 67.0 in | Wt 174.0 lb

## 2021-12-16 DIAGNOSIS — I10 Essential (primary) hypertension: Secondary | ICD-10-CM

## 2021-12-16 MED ORDER — ATORVASTATIN CALCIUM 20 MG PO TABS: 20.0000 mg | ORAL_TABLET | Freq: Every day | ORAL | 2 refills | Status: DC

## 2021-12-16 MED ORDER — LISINOPRIL 20 MG PO TABS: 20.0000 mg | ORAL_TABLET | Freq: Every day | ORAL | 2 refills | Status: DC

## 2021-12-16 NOTE — Progress Notes (Signed)
Patient presents today for bp check, currently taking amlodipine 2.'5mg'$  at night and lisinopril-hydrochlorothiazide 10-12.'5mg'$ . Patient wants lisinopril '20mg'$  and to do away with the hydrochlorothiazide 12.'5mg'$ .  BP Readings from Last 3 Encounters:  12/16/21 (!) 140/68  11/24/21 122/68  08/15/21 131/65  Per patient request - provider started Lisinopril '20mg'$  and kept Amlodipine  nightly.

## 2022-01-06 ENCOUNTER — Ambulatory Visit: Payer: Medicare Other

## 2022-01-06 VITALS — BP 130/60 | HR 60 | Temp 97.8°F

## 2022-01-06 DIAGNOSIS — I10 Essential (primary) hypertension: Secondary | ICD-10-CM

## 2022-01-06 NOTE — Progress Notes (Signed)
Patient presents today for BPC. He is currently Lisinopril '20mg'$  in the morning and Amlodipine 2.'5mg'$ .   BP Readings from Last 3 Encounters:  01/06/22 130/60  12/16/21 138/68  11/24/21 122/68   Patient would like to  take both medications at night.  Provider agrees.

## 2022-01-29 ENCOUNTER — Telehealth: Payer: Self-pay | Admitting: Oncology

## 2022-01-29 NOTE — Telephone Encounter (Signed)
Called patient per dr. Alen Blew transition. Left voicemail for patient to call us back so we can r/s future appointment.

## 2022-02-11 ENCOUNTER — Telehealth: Payer: Self-pay | Admitting: Oncology

## 2022-02-11 NOTE — Telephone Encounter (Signed)
Called patient per dr. Alen Blew transition. Patient notified and r/s

## 2022-02-17 ENCOUNTER — Other Ambulatory Visit: Payer: Self-pay | Admitting: Nurse Practitioner

## 2022-02-17 DIAGNOSIS — I10 Essential (primary) hypertension: Secondary | ICD-10-CM

## 2022-03-26 ENCOUNTER — Ambulatory Visit (INDEPENDENT_AMBULATORY_CARE_PROVIDER_SITE_OTHER): Payer: Medicare Other | Admitting: Nurse Practitioner

## 2022-03-26 ENCOUNTER — Encounter: Payer: Self-pay | Admitting: Nurse Practitioner

## 2022-03-26 VITALS — BP 136/70 | HR 64 | Temp 98.2°F | Ht 67.0 in | Wt 182.0 lb

## 2022-03-26 DIAGNOSIS — Z8616 Personal history of COVID-19: Secondary | ICD-10-CM

## 2022-03-26 DIAGNOSIS — I10 Essential (primary) hypertension: Secondary | ICD-10-CM | POA: Diagnosis not present

## 2022-03-26 DIAGNOSIS — E782 Mixed hyperlipidemia: Secondary | ICD-10-CM

## 2022-03-26 DIAGNOSIS — I700 Atherosclerosis of aorta: Secondary | ICD-10-CM | POA: Diagnosis not present

## 2022-03-26 MED ORDER — ATORVASTATIN CALCIUM 20 MG PO TABS: 20.0000 mg | ORAL_TABLET | Freq: Every day | ORAL | 1 refills | Status: AC

## 2022-03-26 MED ORDER — AMLODIPINE BESYLATE 5 MG PO TABS: 5.0000 mg | ORAL_TABLET | Freq: Every day | ORAL | 1 refills | Status: DC

## 2022-03-26 NOTE — Patient Instructions (Signed)
Hypertension, Adult High blood pressure (hypertension) is when the force of blood pumping through the arteries is too strong. The arteries are the blood vessels that carry blood from the heart throughout the body. Hypertension forces the heart to work harder to pump blood and may cause arteries to become narrow or stiff. Untreated or uncontrolled hypertension can lead to a heart attack, heart failure, a stroke, kidney disease, and other problems. A blood pressure reading consists of a higher number over a lower number. Ideally, your blood pressure should be below 120/80. The first ("top") number is called the systolic pressure. It is a measure of the pressure in your arteries as your heart beats. The second ("bottom") number is called the diastolic pressure. It is a measure of the pressure in your arteries as the heart relaxes. What are the causes? The exact cause of this condition is not known. There are some conditions that result in high blood pressure. What increases the risk? Certain factors may make you more likely to develop high blood pressure. Some of these risk factors are under your control, including: Smoking. Not getting enough exercise or physical activity. Being overweight. Having too much fat, sugar, calories, or salt (sodium) in your diet. Drinking too much alcohol. Other risk factors include: Having a personal history of heart disease, diabetes, high cholesterol, or kidney disease. Stress. Having a family history of high blood pressure and high cholesterol. Having obstructive sleep apnea. Age. The risk increases with age. What are the signs or symptoms? High blood pressure may not cause symptoms. Very high blood pressure (hypertensive crisis) may cause: Headache. Fast or irregular heartbeats (palpitations). Shortness of breath. Nosebleed. Nausea and vomiting. Vision changes. Severe chest pain, dizziness, and seizures. How is this diagnosed? This condition is diagnosed by  measuring your blood pressure while you are seated, with your arm resting on a flat surface, your legs uncrossed, and your feet flat on the floor. The cuff of the blood pressure monitor will be placed directly against the skin of your upper arm at the level of your heart. Blood pressure should be measured at least twice using the same arm. Certain conditions can cause a difference in blood pressure between your right and left arms. If you have a high blood pressure reading during one visit or you have normal blood pressure with other risk factors, you may be asked to: Return on a different day to have your blood pressure checked again. Monitor your blood pressure at home for 1 week or longer. If you are diagnosed with hypertension, you may have other blood or imaging tests to help your health care provider understand your overall risk for other conditions. How is this treated? This condition is treated by making healthy lifestyle changes, such as eating healthy foods, exercising more, and reducing your alcohol intake. You may be referred for counseling on a healthy diet and physical activity. Your health care provider may prescribe medicine if lifestyle changes are not enough to get your blood pressure under control and if: Your systolic blood pressure is above 130. Your diastolic blood pressure is above 80. Your personal target blood pressure may vary depending on your medical conditions, your age, and other factors. Follow these instructions at home: Eating and drinking  Eat a diet that is high in fiber and potassium, and low in sodium, added sugar, and fat. An example of this eating plan is called the DASH diet. DASH stands for Dietary Approaches to Stop Hypertension. To eat this way: Eat   plenty of fresh fruits and vegetables. Try to fill one half of your plate at each meal with fruits and vegetables. Eat whole grains, such as whole-wheat pasta, brown rice, or whole-grain bread. Fill about one  fourth of your plate with whole grains. Eat or drink low-fat dairy products, such as skim milk or low-fat yogurt. Avoid fatty cuts of meat, processed or cured meats, and poultry with skin. Fill about one fourth of your plate with lean proteins, such as fish, chicken without skin, beans, eggs, or tofu. Avoid pre-made and processed foods. These tend to be higher in sodium, added sugar, and fat. Reduce your daily sodium intake. Many people with hypertension should eat less than 1,500 mg of sodium a day. Do not drink alcohol if: Your health care provider tells you not to drink. You are pregnant, may be pregnant, or are planning to become pregnant. If you drink alcohol: Limit how much you have to: 0-1 drink a day for women. 0-2 drinks a day for men. Know how much alcohol is in your drink. In the [place] one drink equals one 12 oz bottle of beer (355 mL), one 5 oz glass of wine (148 mL), or one 1 oz glass of hard liquor (44 mL). Lifestyle  Work with your health care provider to maintain a healthy body weight or to lose weight. Ask what an ideal weight is for you. Get at least 30 minutes of exercise that causes your heart to beat faster (aerobic exercise) most days of the week. Activities may include walking, swimming, or biking. Include exercise to strengthen your muscles (resistance exercise), such as Pilates or lifting weights, as part of your weekly exercise routine. Try to do these types of exercises for 30 minutes at least 3 days a week. Do not use any products that contain nicotine or tobacco. These products include cigarettes, chewing tobacco, and vaping devices, such as e-cigarettes. If you need help quitting, ask your health care provider. Monitor your blood pressure at home as told by your health care provider. Keep all follow-up visits. This is important. Medicines Take over-the-counter and prescription medicines only as told by your health care provider. Follow directions carefully. Blood  pressure medicines must be taken as prescribed. Do not skip doses of blood pressure medicine. Doing this puts you at risk for problems and can make the medicine less effective. Ask your health care provider about side effects or reactions to medicines that you should watch for. Contact a health care provider if you: Think you are having a reaction to a medicine you are taking. Have headaches that keep coming back (recurring). Feel dizzy. Have swelling in your ankles. Have trouble with your vision. Get help right away if you: Develop a severe headache or confusion. Have unusual weakness or numbness. Feel faint. Have severe pain in your chest or abdomen. Vomit repeatedly. Have trouble breathing. These symptoms may be an emergency. Get help right away. Call 911. Do not wait to see if the symptoms will go away. Do not drive yourself to the hospital. Summary Hypertension is when the force of blood pumping through your arteries is too strong. If this condition is not controlled, it may put you at risk for serious complications. Your personal target blood pressure may vary depending on your medical conditions, your age, and other factors. For most people, a normal blood pressure is less than 120/80. Hypertension is treated with lifestyle changes, medicines, or a combination of both. Lifestyle changes include losing weight, eating a healthy,   low-sodium diet, exercising more, and limiting alcohol. This information is not intended to replace advice given to you by your health care provider. Make sure you discuss any questions you have with your health care provider. Document Revised: 12/24/2020 Document Reviewed: 12/24/2020 Elsevier Patient Education  2023 [employer]  

## 2022-03-26 NOTE — Progress Notes (Signed)
I,Tianna Badgett,acting as a Education administrator for [employer], FNP.,have documented all relevant documentation on the behalf of Minette Brine, FNP,as directed by  Minette Brine, FNP while in the presence of Minette Brine, [place].  Subjective:     Patient ID: Brandon Padilla , male    DOB: 01-Aug-1940 , 82 y.o.   MRN: [id number]   Chief Complaint  Patient presents with   Hypertension    HPI  Patient presents today for HTN follow up. Blood pressure readings have been systolic 825/05 Tuesday afternoon. He does not have a specific exercise regimen but he is working on a garden. He does admit to eating foods that may be high in salt. No additional salt  Hypertension This is a chronic problem. The current episode started more than 1 year ago. The problem is controlled. Pertinent negatives include no anxiety or headaches. Past treatments include beta blockers and ACE inhibitors.     Past Medical History:  Diagnosis Date   Malignant melanoma ([place])      Family History  Problem Relation Age of Onset   Hypertension Mother    Dementia Mother    Heart attack Father    Diabetes Brother    Prostate cancer Brother      Current Outpatient Medications:    clobetasol cream (TEMOVATE) 3.97 %, Apply 1 Application topically 2 (two) times daily., Disp: 60 g, Rfl: 2   lisinopril (ZESTRIL) 20 MG tablet, TAKE 1 TABLET BY MOUTH DAILY, Disp: 90 tablet, Rfl: 1   amLODipine (NORVASC) 5 MG tablet, Take 1 tablet (5 mg total) by mouth daily. In evening, Disp: 90 tablet, Rfl: 1   atorvastatin (LIPITOR) 20 MG tablet, Take 1 tablet (20 mg total) by mouth at bedtime., Disp: 90 tablet, Rfl: 1   No Known Allergies   Review of Systems  Constitutional: Negative.   Respiratory: Negative.    Cardiovascular: Negative.   Gastrointestinal: Negative.   Neurological: Negative.  Negative for headaches.     Today's Vitals   03/26/22 1546  BP: 136/70  Pulse: 64  Temp: 98.2 F (36.8 C)  TempSrc: Oral  Weight: 182 lb (82.6  kg)  Height: '5\' 7"'$  (1.702 m)   Body mass index is 28.51 kg/m.   Objective:  Physical Exam Vitals reviewed.  Constitutional:      General: He is not in acute distress.    Appearance: Normal appearance.  HENT:     Head: Normocephalic.  Cardiovascular:     Rate and Rhythm: Normal rate and regular rhythm.     Pulses: Normal pulses.     Heart sounds: Normal heart sounds. No murmur heard. Pulmonary:     Effort: Pulmonary effort is normal. No respiratory distress.     Breath sounds: Normal breath sounds. No wheezing.  Musculoskeletal:        General: Normal range of motion.  Skin:    General: Skin is warm and dry.     Capillary Refill: Capillary refill takes less than 2 seconds.  Neurological:     General: No focal deficit present.     Mental Status: He is alert and oriented to person, place, and time.  Psychiatric:        Mood and Affect: Mood normal.        Behavior: Behavior normal.        Thought Content: Thought content normal.        Judgment: Judgment normal.         Assessment And Plan:  1. Essential hypertension Comments: Blood pressure at night time is above 140's in the evening. Will increase Lisinopril - amLODipine (NORVASC) 5 MG tablet; Take 1 tablet (5 mg total) by mouth daily. In evening  Dispense: 90 tablet; Refill: 1 - BMP8+eGFR  2. Mixed hyperlipidemia Comments: Stable, continue statin. Tolerating well - atorvastatin (LIPITOR) 20 MG tablet; Take 1 tablet (20 mg total) by mouth at bedtime.  Dispense: 90 tablet; Refill: 1 - Lipid panel  3. Aortic atherosclerosis (HCC) Comments: Continue statin, tolerating well  4. History of COVID-19 Comments: 2 months ago but did not have any symptoms, his husband was sick so he went to [place] to be tested. He is getting his Covid booster next month     Patient was given opportunity to ask questions. Patient verbalized understanding of the plan and was able to repeat key elements of the plan. All questions  were answered to their satisfaction.  Minette Brine, FNP   I, Minette Brine, FNP, have reviewed all documentation for this visit. The documentation on 03/26/22 for the exam, diagnosis, procedures, and orders are all accurate and complete.   IF YOU HAVE BEEN REFERRED TO A SPECIALIST, IT MAY TAKE 1-2 WEEKS TO SCHEDULE/PROCESS THE REFERRAL. IF YOU HAVE NOT HEARD FROM US/SPECIALIST IN TWO WEEKS, PLEASE GIVE [place] A CALL AT [phone] X [phone].   THE PATIENT IS ENCOURAGED TO PRACTICE SOCIAL DISTANCING DUE TO THE COVID-19 PANDEMIC.

## 2022-03-27 LAB — BMP8+EGFR
BUN/Creatinine Ratio: 16 (ref 10–24)
BUN: 14 mg/dL (ref 8–27)
CO2: 21 mmol/L (ref 20–29)
Calcium: 9.3 mg/dL (ref 8.6–10.2)
Chloride: 104 mmol/L (ref 96–106)
Creatinine, Ser: 0.87 mg/dL (ref 0.76–1.27)
Glucose: 89 mg/dL (ref 70–99)
Potassium: 4.2 mmol/L (ref 3.5–5.2)
Sodium: 140 mmol/L (ref 134–144)
eGFR: 88 mL/min/{1.73_m2} (ref >59)

## 2022-03-27 LAB — LIPID PANEL
Chol/HDL Ratio: 3.2 ratio (ref 0.0–5.0)
Cholesterol, Total: 189 mg/dL (ref 100–199)
HDL: 60 mg/dL (ref >39)
LDL Chol Calc (NIH): 110 mg/dL — ABNORMAL HIGH (ref 0–99)
Triglycerides: 109 mg/dL (ref 0–149)
VLDL Cholesterol Cal: 19 mg/dL (ref 5–40)

## 2022-05-16 ENCOUNTER — Other Ambulatory Visit: Payer: Self-pay | Admitting: Nurse Practitioner

## 2022-05-16 DIAGNOSIS — I10 Essential (primary) hypertension: Secondary | ICD-10-CM

## 2022-07-28 ENCOUNTER — Ambulatory Visit: Payer: Medicare Other | Admitting: Nurse Practitioner

## 2022-08-05 ENCOUNTER — Telehealth: Payer: Self-pay | Admitting: Internal Medicine

## 2022-08-05 NOTE — Telephone Encounter (Signed)
Rescheduled June appointment, informed patient new appointment time. Left a voicemail.

## 2022-08-11 ENCOUNTER — Other Ambulatory Visit: Payer: Medicare Other

## 2022-08-13 ENCOUNTER — Ambulatory Visit: Payer: Medicare Other | Admitting: Physician Assistant

## 2022-08-13 ENCOUNTER — Other Ambulatory Visit: Payer: Medicare Other

## 2022-08-14 ENCOUNTER — Ambulatory Visit: Payer: Medicare Other | Admitting: Oncology

## 2022-08-14 ENCOUNTER — Other Ambulatory Visit: Payer: Medicare Other

## 2022-08-14 NOTE — Progress Notes (Unsigned)
[facility] OFFICE PROGRESS NOTE  Arnette Felts, FNP 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   PRIOR THERAPY:  He is status post wide excision and sentinel lymph node sampling completed in 2020 while living in [place].   CURRENT THERAPY: Active surveillance.   INTERVAL HISTORY: Brandon Padilla 82 y.o. male returns to the clinic for a follow up visit. The patient is feeling well today without any concerning complaints. He is followed for his history of melanoma. He was previously followed by Dr. Clelia Croft, who has since left the practice.  Does not have any physical symptoms or concerns today.  He does have other concerns today.  First, his husband is a patient of Dr. Leonides Schanz and has advanced prostate cancer.  The patient is worried about his own life expectancy and if he is going to be around to take care of his husband.  He is worried about his blood pressure and his scans revealing atherosclerosis.  He would like to stay within the [facility] system and he is wondering if he can see a cardiologist.  He is concerned about his hypertension and that he has been maxed out on lisinopril.  He is wanting to talk to cardiologist about his risk of heart disease. Additionally, before he moved here from [facility] and was part of a 5 year surveillance CT program. He is wondering if he can have repeat CT scan as it has been over 1 year since his last CT scan.   He sees dermatology every 6 months. 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     MEDICAL HISTORY: Past Medical History:  Diagnosis Date   Malignant melanoma (HCC)     ALLERGIES:  has No Known Allergies.  MEDICATIONS:  Current Outpatient Medications  Medication Sig Dispense Refill   lisinopril (ZESTRIL) 40 MG tablet Take 40 mg by mouth daily.     atorvastatin (LIPITOR) 20 MG tablet Take 1 tablet (20 mg total) by  mouth at bedtime. 90 tablet 1   clobetasol cream (TEMOVATE) 0.05 % Apply 1 Application topically 2 (two) times daily. 60 g 2   No current facility-administered medications for this visit.    SURGICAL HISTORY:  Past Surgical History:  Procedure Laterality Date   excision Right    excision of melanoma   left foot surgery Left     REVIEW OF SYSTEMS:   Review of Systems  Constitutional: Negative for appetite change, chills, fatigue, fever and unexpected weight change.  HENT: Negative for mouth sores, nosebleeds, sore throat and trouble swallowing.   Eyes: Negative for eye problems and icterus.  Respiratory: Negative for cough, hemoptysis, shortness of breath and wheezing.   Cardiovascular: Negative for chest pain and leg swelling.  Gastrointestinal: Negative for abdominal pain, constipation, diarrhea, nausea and vomiting.  Genitourinary: Negative for bladder incontinence, difficulty urinating, dysuria, frequency and hematuria.   Musculoskeletal: Negative for back pain, gait problem, neck pain and neck stiffness.  Skin: Negative for itching and rash.  Neurological: Negative for dizziness, extremity weakness, gait problem, headaches, light-headedness and seizures.  Hematological: Negative for adenopathy. Does not bruise/bleed easily.  Psychiatric/Behavioral: Negative for confusion, depression and sleep disturbance. The patient is not nervous/anxious.     PHYSICAL EXAMINATION:  Blood pressure 136/69, pulse 65, temperature 97.6 F (36.4 C), temperature source Oral, resp. rate 14, weight 170 lb 4.8 oz (  77.2 kg), SpO2 98 %.  ECOG PERFORMANCE STATUS: 1  Physical Exam  Constitutional: Oriented to person, place, and time and well-developed, well-nourished, and in no distress.  HENT:  Head: Normocephalic and atraumatic.  Mouth/Throat: Oropharynx is clear and moist. No oropharyngeal exudate.  Eyes: Conjunctivae are normal. Right eye exhibits no discharge. Left eye exhibits no discharge. No  scleral icterus.  Neck: Normal range of motion. Neck supple.  Cardiovascular: Normal rate, regular rhythm, normal heart sounds and intact distal pulses.   Pulmonary/Chest: Effort normal and breath sounds normal. No respiratory distress. No wheezes. No rales.  Abdominal: Soft. Bowel sounds are normal. Exhibits no distension and no mass. There is no tenderness.  Musculoskeletal: Normal range of motion. Exhibits no edema.  Lymphadenopathy:    No cervical adenopathy.  Neurological: Alert and oriented to person, place, and time. Exhibits normal muscle tone. Gait normal. Coordination normal.  Skin: Scar on left scalp. Skin is warm and dry. No rash noted. Not diaphoretic. No erythema. No pallor.  Psychiatric: Mood, memory and judgment normal.  Vitals reviewed.  LABORATORY DATA: Lab Results  Component Value Date   WBC 5.6 08/18/2022   HGB 12.7 (L) 08/18/2022   HCT 37.0 (L) 08/18/2022   MCV 90.7 08/18/2022   PLT 214 08/18/2022      Chemistry      Component Value Date/Time   NA 138 08/18/2022 1429   NA 140 03/26/2022 1639   K 4.2 08/18/2022 1429   CL 104 08/18/2022 1429   CO2 29 08/18/2022 1429   BUN 14 08/18/2022 1429   BUN 14 03/26/2022 1639   CREATININE 0.86 08/18/2022 1429      Component Value Date/Time   CALCIUM 9.3 08/18/2022 1429   ALKPHOS 54 08/18/2022 1429   AST 14 (L) 08/18/2022 1429   ALT 16 08/18/2022 1429   BILITOT 0.7 08/18/2022 1429       RADIOGRAPHIC STUDIES:  No results found.   ASSESSMENT/PLAN:  This is a very pleasant 82 year old caucasian male with a history of stage II  melanoma of the forearm diagnosed in in 2020.  He had completed wide excision and sentinel lymph node sampling without any additional treatment.    The patient was previously followed by Dr. Clelia Croft, who has since left the practice and will be establishing care with Dr. Arbutus Ped.   The patient was seen with Dr. Arbutus Ped. Labs were reviewed.  Will arrange for 1 more CT scan of the chest,  abdomen, pelvis for restaging of his disease.  If his CT scan does not show any disease progression, then we would arrange for CT scans on an as-needed basis.  Regarding the patient's concern for his atherosclerosis and hypertension, the patient would like to be seen within the [facility] system.  He states his PCP is at [facility].  The patient has a lot of questions regarding his cardiac related risks. I answered his questions to the best of my ability. The patient had several questions today.  He is very concerned about being able to care for his sick husband while he is undergoing treatment for his prostate cancer.  I have placed a referral to [facility] cardiology for hypertension management and risk of heart disease.   The patient's labs today show mild anemia with hemoglobin of 12.7.  This is new and the patient states he had lab work drawn at his PCPs office last month and had a normal hemoglobin.  We will monitor this for now.  He sees  his PCP next month for follow-up.  If he has any worsening anemia, discussed with the patient and recommend iron, ferritin, B12 to start with.   We will see him back for labs and follow up in 1 year. I have requested LDH to be added on to his labs but they do not have the correct tubes. No need to arrange for additional LDH/lab draw, will add this to his labs next year.   Continue to follow with dermatology every 6 months.  The patient also had questions about having his PSA drawn.  I encouraged him to talk to his PCP next month about this.  The patient was advised to call immediately if she has any concerning symptoms in the interval. The patient voices understanding of current disease status and treatment options and is in agreement with the current care plan. All questions were answered. The patient knows to call the clinic with any problems, questions or concerns. We can certainly see the patient much sooner if necessary    Orders Placed This Encounter   Procedures   CT CHEST ABDOMEN PELVIS W CONTRAST    Standing Status:   Future    Standing Expiration Date:   08/18/2023    Order Specific Question:   If indicated for the ordered procedure, I authorize the administration of contrast media per Radiology protocol    Answer:   Yes    Order Specific Question:   Does the patient have a contrast media/X-ray dye allergy?    Answer:   No    Order Specific Question:   Preferred imaging location?    Answer:   [facility]    Order Specific Question:   If indicated for the ordered procedure, I authorize the administration of oral contrast media per Radiology protocol    Answer:   Yes   CBC with Differential (Cancer Center Only)    Standing Status:   Future    Standing Expiration Date:   08/18/2023   CMP (Cancer Center only)    Standing Status:   Future    Standing Expiration Date:   08/18/2023   Lactate dehydrogenase (LDH)    Standing Status:   Future    Standing Expiration Date:   08/18/2023   Ambulatory referral to Cardiology    Referral Priority:   Routine    Referral Type:   Consultation    Referral Reason:   Specialty Services Required    Number of Visits Requested:   1      Jailee Jaquez L Kawanda Drumheller, PA-C 08/18/22  ADDENDUM: Hematology/Oncology Attending: I had a face to face encounter with the patient today.  I reviewed his record, lab and recommended his care plan.  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  He was followed by Dr. Clelia Croft when he moved to [place].  The patient came today to establish care with me after Dr. Clelia Croft left the practice.  He has been in observation for the last several years.  His last CT scan was performed in June 2023.  The patient has been doing fine with no concerning complaints except for some areas of  his skin cancer removed by dermatology.  He has no significant complaints today but he is concerned about his atherosclerosis that was reported on previous imaging studies  and he would like to see cardiology for further evaluation. The patient is interested in having annual CT imaging.  I explained to the patient that I will be happy to repeat his scans this year but in the absence of any symptoms or concerning findings we may not be able to do it on an annual basis and will do it only for any concerning symptoms. He is in agreement with the current plan. Will arrange for him to have CT scan of the chest, abdomen and pelvis in the next 1-2 weeks.  If negative, I will see the patient back for follow-up visit in 1 year with repeat blood work. The patient will continue his routine follow-up and skin exam by his dermatologist. He was advised to call immediately if he has any other concerning symptoms in the interval.  The total time spent in the appointment was 30 minutes. Disclaimer: This note was dictated with voice recognition software. Similar sounding words can inadvertently be transcribed and may be missed upon review. Lajuana Matte, MD

## 2022-08-17 ENCOUNTER — Other Ambulatory Visit: Payer: Self-pay | Admitting: *Deleted

## 2022-08-17 DIAGNOSIS — C439 Malignant melanoma of skin, unspecified: Secondary | ICD-10-CM

## 2022-08-18 ENCOUNTER — Inpatient Hospital Stay: Payer: Medicare Other | Attending: Physician Assistant

## 2022-08-18 ENCOUNTER — Other Ambulatory Visit: Payer: Self-pay

## 2022-08-18 ENCOUNTER — Inpatient Hospital Stay (HOSPITAL_BASED_OUTPATIENT_CLINIC_OR_DEPARTMENT_OTHER): Payer: Medicare Other | Admitting: Physician Assistant

## 2022-08-18 VITALS — BP 136/69 | HR 65 | Temp 97.6°F | Resp 14 | Wt 170.3 lb

## 2022-08-18 DIAGNOSIS — C439 Malignant melanoma of skin, unspecified: Secondary | ICD-10-CM

## 2022-08-18 DIAGNOSIS — I159 Secondary hypertension, unspecified: Secondary | ICD-10-CM | POA: Diagnosis not present

## 2022-08-18 DIAGNOSIS — D649 Anemia, unspecified: Secondary | ICD-10-CM | POA: Diagnosis not present

## 2022-08-18 DIAGNOSIS — Z79899 Other long term (current) drug therapy: Secondary | ICD-10-CM | POA: Insufficient documentation

## 2022-08-18 DIAGNOSIS — C4361 Malignant melanoma of right upper limb, including shoulder: Secondary | ICD-10-CM | POA: Insufficient documentation

## 2022-08-18 LAB — CMP (CANCER CENTER ONLY)
ALT: 16 U/L (ref 0–44)
AST: 14 U/L — ABNORMAL LOW (ref 15–41)
Albumin: 3.8 g/dL (ref 3.5–5.0)
Alkaline Phosphatase: 54 U/L (ref 38–126)
Anion gap: 5 (ref 5–15)
BUN: 14 mg/dL (ref 8–23)
CO2: 29 mmol/L (ref 22–32)
Calcium: 9.3 mg/dL (ref 8.9–10.3)
Chloride: 104 mmol/L (ref 98–111)
Creatinine: 0.86 mg/dL (ref 0.61–1.24)
GFR, Estimated: >60 mL/min (ref >60)
Glucose, Bld: 118 mg/dL — ABNORMAL HIGH (ref 70–99)
Potassium: 4.2 mmol/L (ref 3.5–5.1)
Sodium: 138 mmol/L (ref 135–145)
Total Bilirubin: 0.7 mg/dL (ref 0.3–1.2)
Total Protein: 6.5 g/dL (ref 6.5–8.1)

## 2022-08-18 LAB — CBC WITH DIFFERENTIAL (CANCER CENTER ONLY)
Abs Immature Granulocytes: 0.01 10*3/uL (ref 0.00–0.07)
Basophils Absolute: 0 10*3/uL (ref 0.0–0.1)
Basophils Relative: 1 %
Eosinophils Absolute: 0 10*3/uL (ref 0.0–0.5)
Eosinophils Relative: 0 %
HCT: 37 % — ABNORMAL LOW (ref 39.0–52.0)
Hemoglobin: 12.7 g/dL — ABNORMAL LOW (ref 13.0–17.0)
Immature Granulocytes: 0 %
Lymphocytes Relative: 23 %
Lymphs Abs: 1.3 10*3/uL (ref 0.7–4.0)
MCH: 31.1 pg (ref 26.0–34.0)
MCHC: 34.3 g/dL (ref 30.0–36.0)
MCV: 90.7 fL (ref 80.0–100.0)
Monocytes Absolute: 0.5 10*3/uL (ref 0.1–1.0)
Monocytes Relative: 9 %
Neutro Abs: 3.7 10*3/uL (ref 1.7–7.7)
Neutrophils Relative %: 67 %
Platelet Count: 214 10*3/uL (ref 150–400)
RBC: 4.08 MIL/uL — ABNORMAL LOW (ref 4.22–5.81)
RDW: 12.5 % (ref 11.5–15.5)
WBC Count: 5.6 10*3/uL (ref 4.0–10.5)
nRBC: 0 % (ref 0.0–0.2)

## 2022-08-25 IMAGING — CT CT CHEST-ABD-PELV W/ CM
2 of 5 series · 13 of 36 positions shown, 15 images · IV contrast (APPLIED)
Comparison: None Available.

CLINICAL DATA: Melanoma restaging. By report a right forearm
melanoma had been previously diagnosed in 0202 with localized
excision and sentinel node sampling indicating stage II disease.

* Tracking Code: BO *
EXAM:
CT CHEST, ABDOMEN, AND PELVIS WITH CONTRAST
TECHNIQUE: Multidetector CT imaging of the chest, abdomen and pelvis was
performed following the standard protocol during bolus
administration of intravenous contrast.

[Series 2: cap with · axial · 0.80mm/px · z∈[+1171,+1691]mm · 10 of 128 slices shown, 12 images]
[im 12/128  mediastinal]
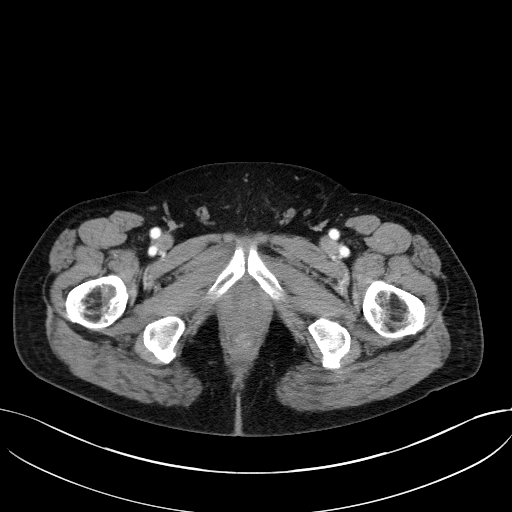
[im 12/128  bone]
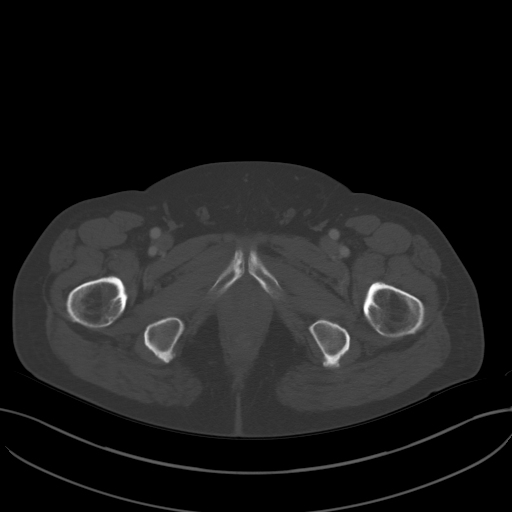
[im 24/128  mediastinal]
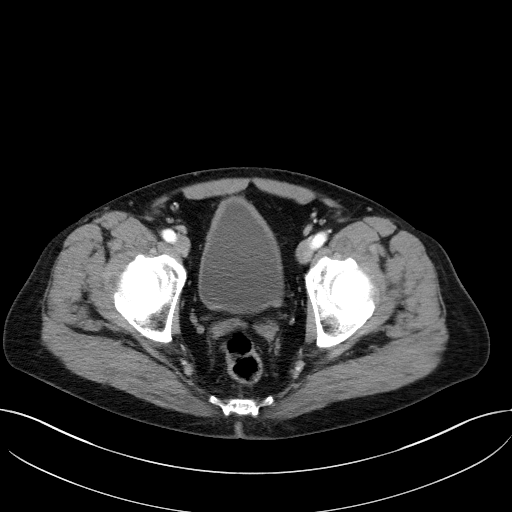
[im 35/128  mediastinal]
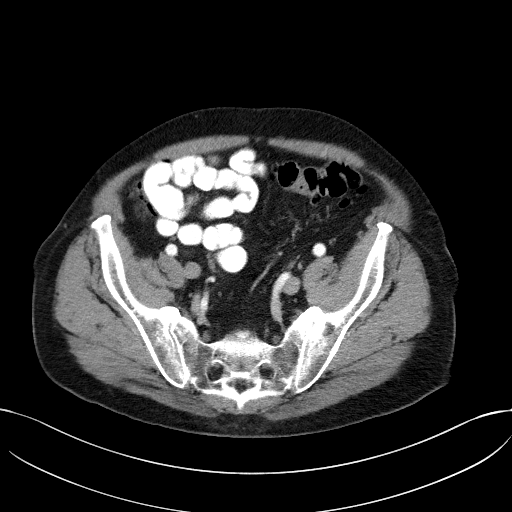
[im 47/128  mediastinal]
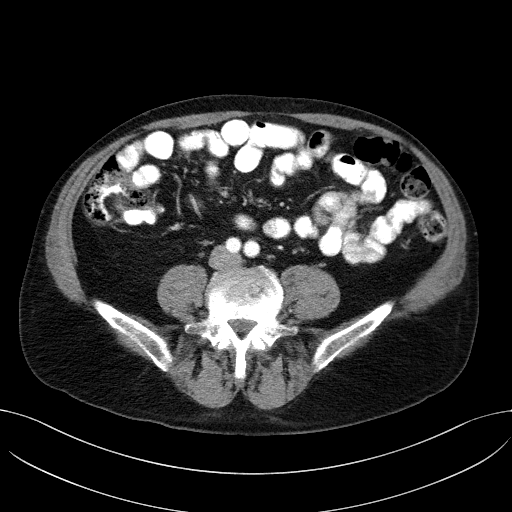
[im 58/128  mediastinal]
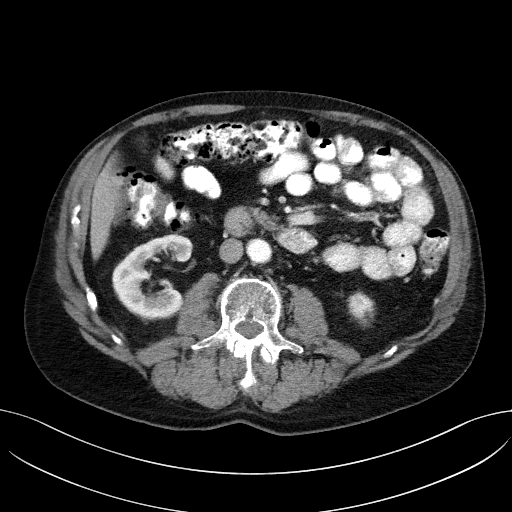
[im 70/128  mediastinal]
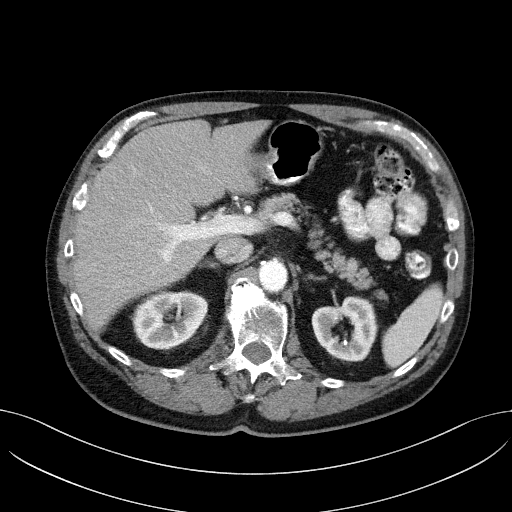
[im 81/128  mediastinal]
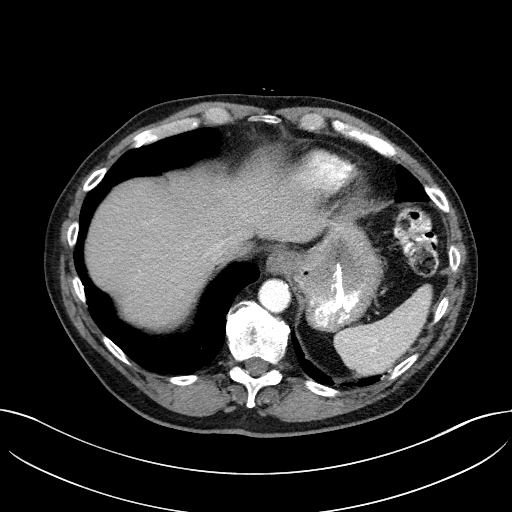
[im 93/128  mediastinal]
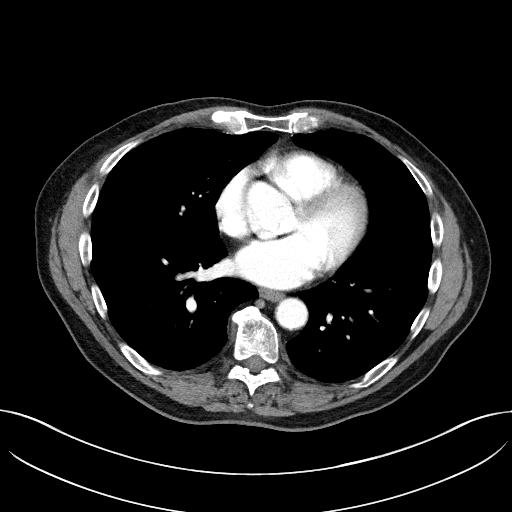
[im 104/128  mediastinal]
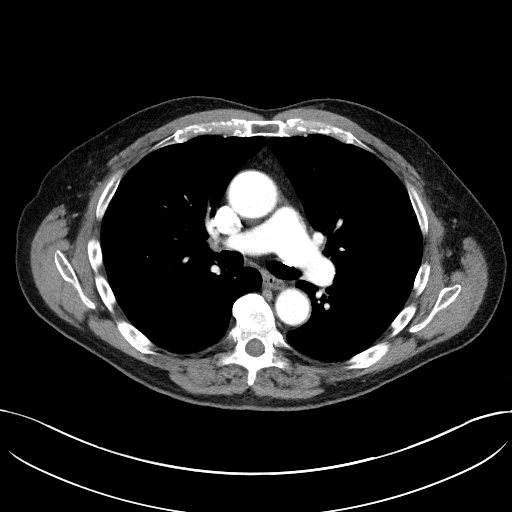
[im 104/128  bone]
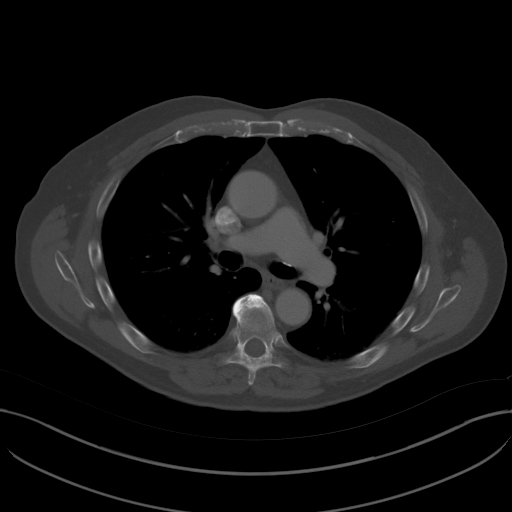
[im 116/128  mediastinal]
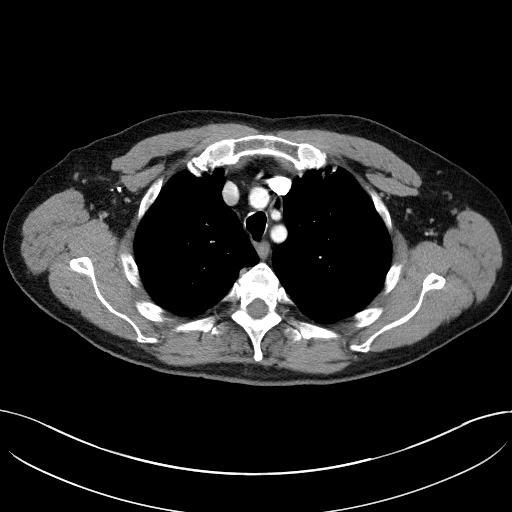

[Series 5: coronals · coronal · 0.82mm/px · 3 of 144 slices shown]
[im 29/144  mediastinal]
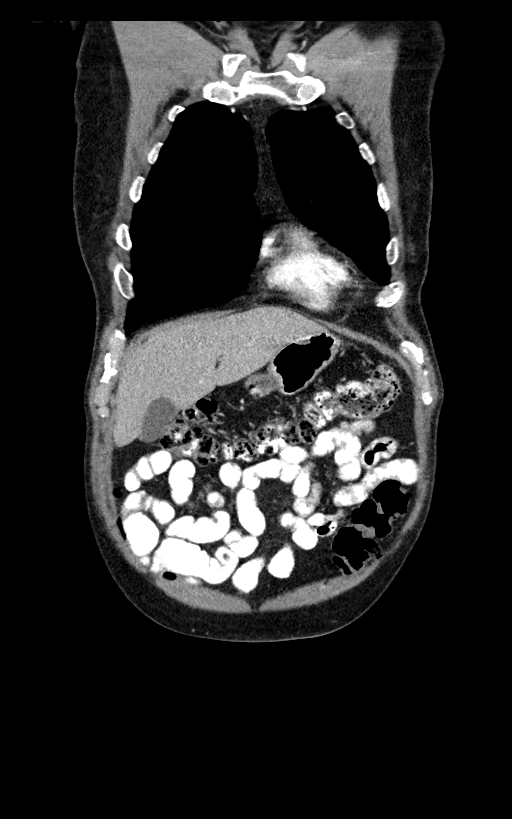
[im 58/144  mediastinal]
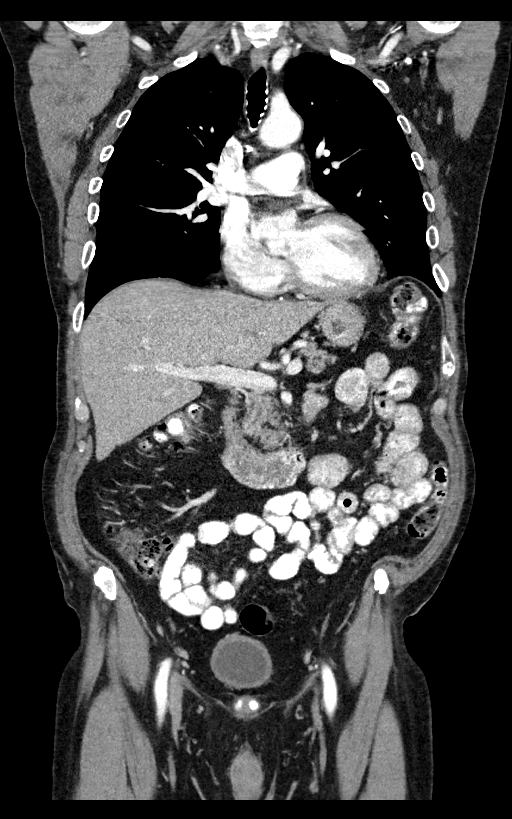
[im 86/144  mediastinal]
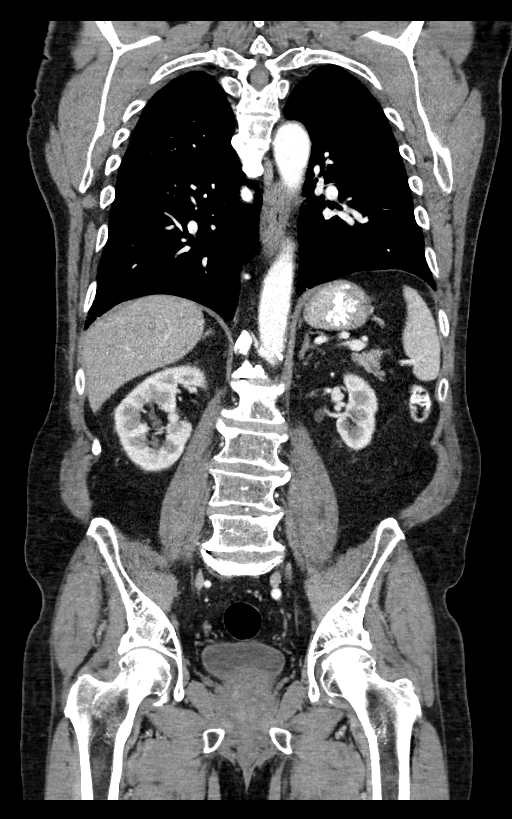

[13 of 36 positions shown; findings below may reference images not displayed]

RADIATION DOSE REDUCTION: This exam was performed according to the
departmental dose-optimization program which includes automated
exposure control, adjustment of the mA and/or kV according to
patient size and/or use of iterative reconstruction technique.

CONTRAST:  100mL OMNIPAQUE IOHEXOL 300 MG/ML  SOLN
FINDINGS: CT CHEST FINDINGS

Cardiovascular: Atherosclerotic calcification of the thoracic aorta
and aortic arch branch vessels.

Mediastinum/Nodes: Unremarkable.  Prior right axillary dissection.

Lungs/Pleura: Triangular 3 by 4 mm right middle lobe nodule on image
108 series 4. Old granulomatous disease.

Musculoskeletal: Thoracic spondylosis.

CT ABDOMEN PELVIS FINDINGS

Hepatobiliary: Unremarkable

Pancreas: Unremarkable

Spleen: Unremarkable

Adrenals/Urinary Tract: Small bilateral renal peripelvic cysts. At
least partially duplicated right renal collecting system. 4 mm left
kidney upper pole nonobstructive renal calculus, image 59 series 2.

Stomach/Bowel: Sigmoid colon diverticulosis.

Vascular/Lymphatic: Atherosclerosis is present, including aortoiliac
atherosclerotic disease. No pathologic adenopathy.

Reproductive: The prostate gland measures 5.1 by 4.2 by 5.3 cm
(volume = 59 cm^3), compatible with prostatomegaly.

Other: No supplemental non-categorized findings.

Musculoskeletal: Bridging spurring of both sacroiliac joints. Lumbar
spondylosis and degenerative disc disease contributing to mild to
moderate levels of impingement at L3-4, L4-5, and L5-S1.
IMPRESSION: 1. No specific findings of active malignancy in the chest, abdomen,
or pelvis.
2. A triangular 4 by 3 mm nodule in the right middle lobe is highly
likely to benign based on morphology and size, although Lesya
follow up criteria do not apply given the patient's history of
malignancy. If clinically warranted, surveillance CT in 1 years time
could be utilized; if patient has prior imaging workup which can be
provided, we are happy to compare to assess for stability.
3. Other imaging findings of potential clinical significance: Aortic
Atherosclerosis ([id number].I). At least partially duplicated right
renal collecting system. Nonobstructive left nephrolithiasis.
Sigmoid colon diverticulosis. Prostatomegaly. Mild to moderate
degrees of lower lumbar impingement.

## 2022-08-27 ENCOUNTER — Ambulatory Visit (HOSPITAL_COMMUNITY)
Admission: RE | Admit: 2022-08-27 | Discharge: 2022-08-27 | Disposition: A | Discharge status: Home / Self Care | Payer: Medicare Other | Source: Ambulatory Visit | Attending: Physician Assistant | Admitting: Physician Assistant

## 2022-08-27 DIAGNOSIS — C439 Malignant melanoma of skin, unspecified: Secondary | ICD-10-CM | POA: Diagnosis present

## 2022-08-27 MED ORDER — IOHEXOL 300 MG/ML  SOLN
100.0000 mL | Freq: Once | INTRAMUSCULAR | Status: AC | PRN
  Administered 2022-08-27: 100 mL via INTRAVENOUS

## 2022-08-27 MED ORDER — SODIUM CHLORIDE (PF) 0.9 % IJ SOLN
INTRAMUSCULAR | Status: AC
  Filled 2022-08-27: qty 50

## 2022-08-31 ENCOUNTER — Encounter: Payer: Self-pay | Admitting: Physician Assistant

## 2022-10-07 NOTE — Progress Notes (Unsigned)
Cardiology Office Note:   Date:  10/08/2022  NAME:  Brandon Padilla    MRN: [id number] DOB:  March 29, 1940   PCP:  Arnette Felts, FNP  Cardiologist:  None  Electrophysiologist:  None   Referring MD: Heilingoetter, Cassandr*   Chief Complaint  Patient presents with   aortic atherosclerosis    History of Present Illness:   Brandon Padilla is a 82 y.o. male with a hx of melanoma, HTN who is being seen today for the evaluation of aortic atherosclerosis at the request of Heilingoetter, Cassandr*.  He presents for the evaluation of aortic atherosclerosis.  He has had a history of melanoma.  He undergoes surveillance CT scans.  Most recent CT scan does show aortic atherosclerosis.  I have reviewed this.  This is minimal and expected for his age.  He has no coronary calcium.  Most recent lipids showed an LDL of 110.  He reports no chest pains or trouble breathing.  Relocated from [place] to be closer to his husband's family.  Husband was diagnosed with metastatic prostate cancer.  He is now the primary caregiver.  He reports that is stressful.  Has had issues with blood pressure.  Controlled on lisinopril 40 mg daily and HCTZ 25 mg daily.  He is working on his diet.  Not exercising regularly but does walking.  No chest pains or trouble breathing.  He has plans to lose weight.  CV exam unremarkable.  EKG does show a first-degree AV block.  No evidence of ischemic changes.  He is not a smoker.  He reports alcohol use nightly.  No drug use.  He is a retired Engineer, civil (consulting).  He is married.  Reports no children.  Denies any symptoms in office today.  We did discuss that his heart is quite healthy.  I have no recommendations for further testing.  CT Chest 08/28/2022 -> 0 coronary calcium  T chol 189, TG 109, LDL 110, HDL 60  Problem List HTN Melanoma   Past Medical History: Past Medical History:  Diagnosis Date   Hyperlipidemia    Hypertension    Malignant melanoma (HCC)     Past Surgical  History: Past Surgical History:  Procedure Laterality Date   excision Right    excision of melanoma   left foot surgery Left     Current Medications: Current Meds  Medication Sig   atorvastatin (LIPITOR) 20 MG tablet Take 1 tablet (20 mg total) by mouth at bedtime.   clobetasol cream (TEMOVATE) 0.05 % Apply 1 Application topically 2 (two) times daily.   lisinopril (ZESTRIL) 40 MG tablet Take 40 mg by mouth daily.     Allergies:    Patient has no known allergies.   Social History: Social History   Socioeconomic History   Marital status: Married    Spouse name: Not on file   Number of children: Not on file   Years of education: Not on file   Highest education level: Not on file  Occupational History   Occupation: retired   Occupation: Retired Charity fundraiser  Tobacco Use   Smoking status: Never   Smokeless tobacco: Never  Substance and Sexual Activity   Alcohol use: Yes    Comment: socially he drinks wine   Drug use: Not Currently   Sexual activity: Yes    Birth control/protection: None  Other Topics Concern   Not on file  Social History Narrative   Not on file   Social Determinants of Health   Financial  Resource Strain: Low Risk  (11/24/2021)   Overall Financial Resource Strain (CARDIA)    Difficulty of Paying Living Expenses: Not hard at all  Food Insecurity: No Food Insecurity (11/24/2021)   Hunger Vital Sign    Worried About Running Out of Food in the Last Year: Never true    Ran Out of Food in the Last Year: Never true  Transportation Needs: No Transportation Needs (11/24/2021)   PRAPARE - Administrator, Civil Service (Medical): No    Lack of Transportation (Non-Medical): No  Physical Activity: Sufficiently Active (11/24/2021)   Exercise Vital Sign    Days of Exercise per Week: 7 days    Minutes of Exercise per Session: 120 min  Stress: No Stress Concern Present (11/24/2021)   [employer] of Occupational Health - Occupational Stress Questionnaire     Feeling of Stress : Not at all  Social Connections: Moderately Isolated (11/24/2021)   Social Connection and Isolation Panel [NHANES]    Frequency of Communication with Friends and Family: More than three times a week    Frequency of Social Gatherings with Friends and Family: Once a week    Attends Religious Services: Never    Database administrator or Organizations: No    Attends Engineer, structural: Never    Marital Status: Married     Family History: The patient's family history includes Dementia in his mother; Diabetes in his brother; Heart attack in his father; Hypertension in his mother; Prostate cancer in his brother.  ROS:   All other ROS reviewed and negative. Pertinent positives noted in the HPI.     EKGs/Labs/Other Studies Reviewed:   The following studies were personally reviewed by me today:  EKG:  EKG is ordered today.    EKG Interpretation Date/Time:  Thursday October 08 2022 08:19:14 EDT Ventricular Rate:  51 PR Interval:  212 QRS Duration:  88 QT Interval:  432 QTC Calculation: 398 R Axis:   2  Text Interpretation: Sinus bradycardia with 1st degree A-V block Borderline ECG Confirmed by Lennie Odor ([id number]) on 10/08/2022 8:27:07 AM   Recent Labs: 08/18/2022: ALT 16; BUN 14; Creatinine 0.86; Hemoglobin 12.7; Platelet Count 214; Potassium 4.2; Sodium 138   Recent Lipid Panel    Component Value Date/Time   CHOL 189 03/26/2022 1639   TRIG 109 03/26/2022 1639   HDL 60 03/26/2022 1639   CHOLHDL 3.2 03/26/2022 1639   LDLCALC 110 (H) 03/26/2022 1639    Physical Exam:   VS:  BP (!) 118/58 (BP Location: Left Arm, Patient Position: Sitting, Cuff Size: Normal)   Pulse (!) 52   Ht 5\' 7"  (1.702 m)   SpO2 98%   BMI 26.67 kg/m    Wt Readings from Last 3 Encounters:  08/18/22 170 lb 4.8 oz (77.2 kg)  03/26/22 182 lb (82.6 kg)  12/16/21 174 lb (78.9 kg)    General: Well nourished, well developed, in no acute distress Head: Atraumatic, normal size   Eyes: PEERLA, EOMI  Neck: Supple, no JVD Endocrine: No thryomegaly Cardiac: Normal S1, S2; RRR; no murmurs, rubs, or gallops Lungs: Clear to auscultation bilaterally, no wheezing, rhonchi or rales  Abd: Soft, nontender, no hepatomegaly  Ext: No edema, pulses 2+ Musculoskeletal: No deformities, BUE and BLE strength normal and equal Skin: Warm and dry, no rashes   Neuro: Alert and oriented to person, place, time, and situation, CNII-XII grossly intact, no focal deficits  Psych: Normal mood and affect   ASSESSMENT:  Brandon Padilla is a 82 y.o. male who presents for the following: 1. First degree AV block   2. Primary hypertension   3. Aortic atherosclerosis (HCC)     PLAN:   1. First degree AV block -First-degree AV block.  Can be a normal finding.  CV exam normal.  No symptoms.  No medications with results on this.  For now would recommend watchful waiting.  He has no symptoms.  This is likely a benign finding.  2. Primary hypertension -Well-controlled on exam today.  Will continue current regimen.  We did discuss that his blood pressure is likely related to aging.  He is going to work on losing weight.  He also needs to watch his salt intake.  3. Aortic atherosclerosis (HCC) -Minimal and expected for age.  I also reviewed the chest CT which shows no evidence of coronary calcium.  This is quite reassuring.  He is on Lipitor 20 and can continue this.  This is protective.  We did discuss regular exercise as well as proper blood pressure control.  Given the fact that his calcium score is 0 at his age he is extremely low risk for heart disease development.  He will see [place] back as needed.      Disposition: Return if symptoms worsen or fail to improve.  Medication Adjustments/Labs and Tests Ordered: Current medicines are reviewed at length with the patient today.  Concerns regarding medicines are outlined above.  Orders Placed This Encounter  Procedures   EKG 12-Lead   No  orders of the defined types were placed in this encounter.  Patient Instructions  Medication Instructions:  Your physician recommends that you continue on your current medications as directed. Please refer to the Current Medication list given to you today.  *If you need a refill on your cardiac medications before your next appointment, please call your pharmacy*   Lab Work: NONE If you have labs (blood work) drawn today and your tests are completely normal, you will receive your results only by: MyChart Message (if you have MyChart) OR A paper copy in the mail If you have any lab test that is abnormal or we need to change your treatment, we will call you to review the results.   Testing/Procedures: NONE   Follow-Up: At [facility], you and your health needs are our priority.  As part of our continuing mission to provide you with exceptional heart care, we have created designated Provider Care Teams.  These Care Teams include your primary Cardiologist (physician) and Advanced Practice Providers (APPs -  Physician Assistants and Nurse Practitioners) who all work together to provide you with the care you need, when you need it.  We recommend signing up for the patient portal called "MyChart".  Sign up information is provided on this After Visit Summary.  MyChart is used to connect with patients for Virtual Visits (Telemedicine).  Patients are able to view lab/test results, encounter notes, upcoming appointments, etc.  Non-urgent messages can be sent to your provider as well.   To learn more about what you can do with MyChart, go to [email or web address].    Your next appointment:   As Needed  Provider:   Mackey Birchwood, MD      Signed, Lenna Gilford. Flora Lipps, MD, [facility]  [address] [facility]  [address], [address] [place], [place] [address] [phone]  10/08/2022 8:45 AM

## 2022-10-08 ENCOUNTER — Encounter: Payer: Self-pay | Admitting: Cardiovascular Disease

## 2022-10-08 ENCOUNTER — Ambulatory Visit: Payer: Medicare Other | Attending: Cardiovascular Disease | Admitting: Cardiovascular Disease

## 2022-10-08 VITALS — BP 118/58 | HR 52 | Ht 67.0 in

## 2022-10-08 DIAGNOSIS — I700 Atherosclerosis of aorta: Secondary | ICD-10-CM | POA: Insufficient documentation

## 2022-10-08 DIAGNOSIS — I440 Atrioventricular block, first degree: Secondary | ICD-10-CM | POA: Insufficient documentation

## 2022-10-08 DIAGNOSIS — R9431 Abnormal electrocardiogram [ECG] [EKG]: Secondary | ICD-10-CM

## 2022-10-08 DIAGNOSIS — I10 Essential (primary) hypertension: Secondary | ICD-10-CM | POA: Diagnosis present

## 2022-10-08 NOTE — Patient Instructions (Signed)
Medication Instructions:  Your physician recommends that you continue on your current medications as directed. Please refer to the Current Medication list given to you today.  *If you need a refill on your cardiac medications before your next appointment, please call your pharmacy*   Lab Work: NONE If you have labs (blood work) drawn today and your tests are completely normal, you will receive your results only by: MyChart Message (if you have MyChart) OR A paper copy in the mail If you have any lab test that is abnormal or we need to change your treatment, we will call you to review the results.   Testing/Procedures: NONE   Follow-Up: At [facility], you and your health needs are our priority.  As part of our continuing mission to provide you with exceptional heart care, we have created designated Provider Care Teams.  These Care Teams include your primary Cardiologist (physician) and Advanced Practice Providers (APPs -  Physician Assistants and Nurse Practitioners) who all work together to provide you with the care you need, when you need it.  We recommend signing up for the patient portal called "MyChart".  Sign up information is provided on this After Visit Summary.  MyChart is used to connect with patients for Virtual Visits (Telemedicine).  Patients are able to view lab/test results, encounter notes, upcoming appointments, etc.  Non-urgent messages can be sent to your provider as well.   To learn more about what you can do with MyChart, go to [email or web address].    Your next appointment:   As Needed  Provider:   Mackey Birchwood, MD

## 2023-08-14 NOTE — Progress Notes (Unsigned)
 [facility] OFFICE PROGRESS NOTE  Narda Bacon, MD [address] 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   PRIOR THERAPY: He is status post wide excision and sentinel lymph node sampling completed in 2020 while living in [place].   CURRENT THERAPY: Observation   INTERVAL HISTORY: Brandon Padilla 83 y.o. male returns to the clinic for a follow up visit.  He is followed for his history of melanoma. He was previously followed by Dr. Dirk Fredericks, who has since left the practice. He not have any physical symptoms or concerns today. However, he has been struggling with vertigo. Her went to the ER and had brain MRI which was negative for acute abnormality. They attempted to remove cerumen from the ear. They recommended PCP follow up and they referred to vestibular rehab.   He saw his PCP earlier today. He is wondering if he can have a PSA drawn today as it was reported to be recommended by his PCP. He does have other concerns today. He denies fevers, chills, night sweats, or unexplained weight loss. He denies nausea, vomiting, diarrhea, abdominal pain, or constipation. He denies shortness of breath, cough, and hemoptysis. He denies headaches or vision changes.  He sees dermatology every 6 months. He saw them last week and there were no concerning skin lesions. He is here for labs and follow up visit.     MEDICAL HISTORY: Past Medical History:  Diagnosis Date   Hyperlipidemia    Hypertension    Malignant melanoma (HCC)     ALLERGIES:  has no known allergies.  MEDICATIONS:  Current Outpatient Medications  Medication Sig Dispense Refill   atorvastatin  (LIPITOR) 20 MG tablet Take 1 tablet (20 mg total) by mouth at bedtime. 90 tablet 1   clobetasol  cream (TEMOVATE ) 0.05 % Apply 1 Application topically 2 (two) times daily. 60 g 2   lisinopril  (ZESTRIL ) 40 MG tablet Take 40 mg by mouth daily.     meclizine (ANTIVERT) 25 MG  tablet Take 1 tablet (25 mg total) by mouth 3 (three) times daily as needed for dizziness. 30 tablet 0   No current facility-administered medications for this visit.    SURGICAL HISTORY:  Past Surgical History:  Procedure Laterality Date   excision Right    excision of melanoma   left foot surgery Left     REVIEW OF SYSTEMS:   Review of Systems  Constitutional: Negative for appetite change, chills, fatigue, fever and unexpected weight change.  HENT: Negative for mouth sores, nosebleeds, sore throat and trouble swallowing.   Eyes: Negative for eye problems and icterus.  Respiratory: Negative for cough, hemoptysis, shortness of breath and wheezing.   Cardiovascular: Negative for chest pain and leg swelling.  Gastrointestinal: Negative for abdominal pain, constipation, diarrhea, nausea and vomiting.  Genitourinary: Negative for bladder incontinence, difficulty urinating, dysuria, frequency and hematuria.   Musculoskeletal: Negative for back pain, gait problem, neck pain and neck stiffness.  Skin: Negative for itching and rash.  Neurological: Positive for vertigo. Negative for dizziness, extremity weakness, gait problem, headaches, light-headedness and seizures.  Hematological: Negative for adenopathy. Does not bruise/bleed easily.  Psychiatric/Behavioral: Negative for confusion, depression and sleep disturbance. The patient is not nervous/anxious.     PHYSICAL EXAMINATION:  Pulse 70, temperature 98.6 F (37 C), temperature source Temporal, resp. rate 16, weight 171 lb 4.8 oz (77.7 kg), SpO2 98%.  ECOG PERFORMANCE STATUS: 0  Physical Exam  Constitutional: Oriented to person,  place, and time and well-developed, well-nourished, and in no distress.  HENT:  Head: Normocephalic and atraumatic.  Mouth/Throat: Oropharynx is clear and moist. No oropharyngeal exudate.  Eyes: Conjunctivae are normal. Right eye exhibits no discharge. Left eye exhibits no discharge. No scleral icterus.   Neck: Normal range of motion. Neck supple.  Cardiovascular: Normal rate, regular rhythm, normal heart sounds and intact distal pulses.   Pulmonary/Chest: Effort normal and breath sounds normal. No respiratory distress. No wheezes. No rales.  Abdominal: Soft. Bowel sounds are normal. Exhibits no distension and no mass. There is no tenderness.  Musculoskeletal: Normal range of motion. Exhibits no edema.  Lymphadenopathy:    No cervical adenopathy.  Neurological: Alert and oriented to person, place, and time. Exhibits normal muscle tone. Gait normal. Coordination normal.  Skin: Skin is warm and dry. No rash noted. Not diaphoretic. No erythema. No pallor.  Psychiatric: Mood, memory and judgment normal.  Vitals reviewed.  LABORATORY DATA: Lab Results  Component Value Date   WBC 5.9 08/18/2023   HGB 14.0 08/18/2023   HCT 39.0 08/18/2023   MCV 88.4 08/18/2023   PLT 235 08/18/2023      Chemistry      Component Value Date/Time   NA 134 (L) 08/15/2023 0629   NA 140 03/26/2022 1639   K 4.1 08/15/2023 0629   CL 98 08/15/2023 0629   CO2 23 08/15/2023 0610   BUN 12 08/15/2023 0629   BUN 14 03/26/2022 1639   CREATININE 0.90 08/15/2023 0629   CREATININE 0.86 08/18/2022 1429      Component Value Date/Time   CALCIUM  9.0 08/15/2023 0610   ALKPHOS 37 (L) 08/15/2023 0610   AST 22 08/15/2023 0610   AST 14 (L) 08/18/2022 1429   ALT 20 08/15/2023 0610   ALT 16 08/18/2022 1429   BILITOT 1.4 (H) 08/15/2023 0610   BILITOT 0.7 08/18/2022 1429       RADIOGRAPHIC STUDIES:  MR BRAIN WO CONTRAST Result Date: 08/15/2023 EXAM: MRI BRAIN WITHOUT CONTRAST 08/15/2023 09:37:22 AM TECHNIQUE: Multiplanar multisequence MRI of the head/brain was performed without the administration of intravenous contrast. COMPARISON: None available. CLINICAL HISTORY: Neuro deficit, acute, stroke suspected. Episodes of dizziness lasting several seconds since 1 o'clock yesterday afternoon. Symptoms are worse when turning  his head. FINDINGS: BRAIN AND VENTRICLES: No acute infarct. No intracranial hemorrhage. No mass. No midline shift. No hydrocephalus. The sella is unremarkable. Normal flow voids. Minimal periventricular and scattered subcortical T2 hyperintensities are likely within normal limits for age. ORBITS: Bilateral lens replacements are noted. The globes and orbits are otherwise within normal limits. SINUSES AND MASTOIDS: No acute abnormality. BONES AND SOFT TISSUES: Normal marrow signal. No acute soft tissue abnormality. IMPRESSION: 1. No acute intracranial abnormality related to the reported neuro deficit and suspected stroke. Normal MRI of the brain for age. Electronically signed by: Audree Leas MD 08/15/2023 09:52 AM EDT RP Workstation: [id number]     ASSESSMENT/PLAN:  This is a very pleasant 83 year old caucasian male with a history of stage II  melanoma of the forearm diagnosed in in 2020.  He had completed wide excision and sentinel lymph node sampling without any additional treatment.   The patient was previously followed by Dr. Dirk Fredericks, who has since left the practice and has established care with Dr. Marguerita Shih.    Labs were reviewed. He does not have any clinic concerns for progression. He had a brain MRI for vertigo which was negative for intracranial concerns or metastatic disease to the brain.  We will see him back for labs and follow up visit in 1 year.   Continue to follow with dermatology every 6 months.   The patient requested PSA to be drawn today. We have drawn and extra tube but will require an order from his PCP. We called his PCP for the order to fax to us . After discussion with the patient, he stated not to worry about it and he will follow up with them. He was seen by their office earlier today.   He is concerned about his blood sugar which was 128 in the ER which was with fasting. He is curious what it will be today. I did let him know the reference range is 8 hours of fasting,  therefore, if he is not fasting, it may not give an accurate picture for diabetes and ideally, he a A1C with his PCP may be a more accurate test to assess for diabetes.   The patient was advised to call immediately if he has any concerning symptoms in the interval. The patient voices understanding of current disease status and treatment options and is in agreement with the current care plan. All questions were answered. The patient knows to call the clinic with any problems, questions or concerns. We can certainly see the patient much sooner if necessary       No orders of the defined types were placed in this encounter.   The total time spent in the appointment was 20-29 minutes  Wenonah Milo L Isadore Bokhari, PA-C 08/18/23

## 2023-08-15 ENCOUNTER — Emergency Department (HOSPITAL_COMMUNITY)
Admission: EM | Admit: 2023-08-15 | Discharge: 2023-08-15 | Disposition: A | Discharge status: Home / Self Care | Attending: Emergency Medicine | Admitting: Emergency Medicine

## 2023-08-15 ENCOUNTER — Encounter (HOSPITAL_COMMUNITY): Payer: Self-pay

## 2023-08-15 ENCOUNTER — Emergency Department (HOSPITAL_COMMUNITY)

## 2023-08-15 ENCOUNTER — Other Ambulatory Visit: Payer: Self-pay

## 2023-08-15 DIAGNOSIS — R42 Dizziness and giddiness: Secondary | ICD-10-CM | POA: Diagnosis present

## 2023-08-15 DIAGNOSIS — E871 Hypo-osmolality and hyponatremia: Secondary | ICD-10-CM | POA: Diagnosis not present

## 2023-08-15 DIAGNOSIS — I10 Essential (primary) hypertension: Secondary | ICD-10-CM | POA: Diagnosis not present

## 2023-08-15 DIAGNOSIS — Z79899 Other long term (current) drug therapy: Secondary | ICD-10-CM | POA: Diagnosis not present

## 2023-08-15 DIAGNOSIS — H6122 Impacted cerumen, left ear: Secondary | ICD-10-CM | POA: Diagnosis not present

## 2023-08-15 LAB — CBC
HCT: 39.4 % (ref 39.0–52.0)
Hemoglobin: 13.8 g/dL (ref 13.0–17.0)
MCH: 31.7 pg (ref 26.0–34.0)
MCHC: 35 g/dL (ref 30.0–36.0)
MCV: 90.6 fL (ref 80.0–100.0)
Platelets: 236 10*3/uL (ref 150–400)
RBC: 4.35 MIL/uL (ref 4.22–5.81)
RDW: 11.8 % (ref 11.5–15.5)
WBC: 5.5 10*3/uL (ref 4.0–10.5)
nRBC: 0 % (ref 0.0–0.2)

## 2023-08-15 LAB — COMPREHENSIVE METABOLIC PANEL WITH GFR
ALT: 20 U/L (ref 0–44)
AST: 22 U/L (ref 15–41)
Albumin: 4.1 g/dL (ref 3.5–5.0)
Alkaline Phosphatase: 37 U/L — ABNORMAL LOW (ref 38–126)
Anion gap: 10 (ref 5–15)
BUN: 12 mg/dL (ref 8–23)
CO2: 23 mmol/L (ref 22–32)
Calcium: 9 mg/dL (ref 8.9–10.3)
Chloride: 100 mmol/L (ref 98–111)
Creatinine, Ser: 0.98 mg/dL (ref 0.61–1.24)
GFR, Estimated: >60 mL/min (ref >60)
Glucose, Bld: 128 mg/dL — ABNORMAL HIGH (ref 70–99)
Potassium: 4.1 mmol/L (ref 3.5–5.1)
Sodium: 133 mmol/L — ABNORMAL LOW (ref 135–145)
Total Bilirubin: 1.4 mg/dL — ABNORMAL HIGH (ref 0.0–1.2)
Total Protein: 7.1 g/dL (ref 6.5–8.1)

## 2023-08-15 LAB — RAPID URINE DRUG SCREEN, HOSP PERFORMED
Amphetamines: NOT DETECTED
Barbiturates: NOT DETECTED
Benzodiazepines: NOT DETECTED
Cocaine: NOT DETECTED
Opiates: NOT DETECTED
Tetrahydrocannabinol: NOT DETECTED

## 2023-08-15 LAB — I-STAT CHEM 8, ED
BUN: 12 mg/dL (ref 8–23)
Calcium, Ion: 1.11 mmol/L — ABNORMAL LOW (ref 1.15–1.40)
Chloride: 98 mmol/L (ref 98–111)
Creatinine, Ser: 0.9 mg/dL (ref 0.61–1.24)
Glucose, Bld: 124 mg/dL — ABNORMAL HIGH (ref 70–99)
HCT: 39 % (ref 39.0–52.0)
Hemoglobin: 13.3 g/dL (ref 13.0–17.0)
Potassium: 4.1 mmol/L (ref 3.5–5.1)
Sodium: 134 mmol/L — ABNORMAL LOW (ref 135–145)
TCO2: 22 mmol/L (ref 22–32)

## 2023-08-15 LAB — DIFFERENTIAL
Abs Immature Granulocytes: 0.01 10*3/uL (ref 0.00–0.07)
Basophils Absolute: 0 10*3/uL (ref 0.0–0.1)
Basophils Relative: 0 %
Eosinophils Absolute: 0 10*3/uL (ref 0.0–0.5)
Eosinophils Relative: 0 %
Immature Granulocytes: 0 %
Lymphocytes Relative: 17 %
Lymphs Abs: 1 10*3/uL (ref 0.7–4.0)
Monocytes Absolute: 0.3 10*3/uL (ref 0.1–1.0)
Monocytes Relative: 6 %
Neutro Abs: 4.2 10*3/uL (ref 1.7–7.7)
Neutrophils Relative %: 77 %

## 2023-08-15 LAB — PROTIME-INR
INR: 1 (ref 0.8–1.2)
Prothrombin Time: 13.4 s (ref 11.4–15.2)

## 2023-08-15 LAB — ETHANOL: Alcohol, Ethyl (B): <15 mg/dL (ref <15)

## 2023-08-15 LAB — APTT: aPTT: 26 s (ref 24–36)

## 2023-08-15 MED ORDER — DOCUSATE SODIUM 50 MG/5ML PO LIQD: 1.0000 mL | Freq: Once | ORAL | Status: DC

## 2023-08-15 MED ORDER — ONDANSETRON HCL 4 MG/2ML IJ SOLN
4.0000 mg | Freq: Once | INTRAMUSCULAR | Status: AC
  Administered 2023-08-15: 4 mg via INTRAVENOUS
  Filled 2023-08-15: qty 2

## 2023-08-15 MED ORDER — DOCUSATE SODIUM 50 MG/5ML PO LIQD
10.0000 mg | Freq: Once | ORAL | Status: AC
  Administered 2023-08-15: 10 mg via OTIC
  Filled 2023-08-15: qty 10

## 2023-08-15 MED ORDER — MECLIZINE HCL 25 MG PO TABS: 25.0000 mg | ORAL_TABLET | Freq: Three times a day (TID) | ORAL | 0 refills | Status: AC | PRN

## 2023-08-15 NOTE — ED Notes (Signed)
 This RN reviewed discharge instructions with patient. He verbalized understanding and denied any further questions. PT well appearing upon discharge and reports no pain. Pt ambulated with stable gait to exit with family.

## 2023-08-15 NOTE — ED Provider Notes (Signed)
 [place] EMERGENCY DEPARTMENT AT [facility] Provider Note   CSN: [id number] Arrival date & time: 08/15/23  2130     Patient presents with: Dizziness   Brandon Padilla is a 83 y.o. male.   HPI     This is an 83 year old male with a history of hypertension and hyperlipidemia who presents with dizziness.  Patient reports that he began to have some dizzy spells yesterday around 1 PM.  He states that the dizzy spells are room spinning in nature.  They last for seconds and self resolved.  He has noted that they tend to worsen with turning of his head.  This morning he got up to go to the bathroom and he turned his head to the right and states I felt like I got thrown into the wall.  He did not hit his head or lose consciousness.  He did hit his right arm.  He is not on any blood thinners.  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  He has not noted any weakness, numbness, tingling, strokelike symptoms.  Currently he is asymptomatic.  No known history of vertigo.  Prior to Admission medications   Medication Sig Start Date End Date Taking? Authorizing Provider  atorvastatin  (LIPITOR) 20 MG tablet Take 1 tablet (20 mg total) by mouth at bedtime. 03/26/22   Susanna Epley, FNP  clobetasol  cream (TEMOVATE ) 0.05 % Apply 1 Application topically 2 (two) times daily. 11/24/21   Moore, Janece, FNP  lisinopril  (ZESTRIL ) 40 MG tablet Take 40 mg by mouth daily.    [provider]    Allergies: Patient has no known allergies.    Review of Systems  Constitutional:  Negative for fever.  Respiratory:  Negative for shortness of breath.   Cardiovascular:  Negative for chest pain.  Gastrointestinal:  Positive for nausea.  Neurological:  Positive for dizziness. Negative for headaches.  All other systems reviewed and are negative.   Updated Vital Signs BP (!) 155/74   Pulse 65   Resp 13   SpO2 100%   Physical Exam Vitals and  nursing note reviewed.  Constitutional:      Appearance: He is well-developed. He is not ill-appearing.  HENT:     Head: Normocephalic and atraumatic.   Eyes:     Pupils: Pupils are equal, round, and reactive to light.     Comments: No nystagmus noted   Cardiovascular:     Rate and Rhythm: Normal rate and regular rhythm.     Heart sounds: Normal heart sounds. No murmur heard. Pulmonary:     Effort: Pulmonary effort is normal. No respiratory distress.     Breath sounds: Normal breath sounds. No wheezing.  Abdominal:     General: Bowel sounds are normal.     Palpations: Abdomen is soft.     Tenderness: There is no abdominal tenderness. There is no rebound.   Musculoskeletal:     Cervical back: Neck supple.  Lymphadenopathy:     Cervical: No cervical adenopathy.   Skin:    General: Skin is warm and dry.   Neurological:     Mental Status: He is alert and oriented to person, place, and time.     Comments: Cranial nerves II through XII intact, 5 out of 5 strength in all 4 extremities, no dysmetria to finger-nose-finger  Psychiatric:        Mood and Affect: Mood normal.     (all labs  ordered are listed, but only abnormal results are displayed) Labs Reviewed  COMPREHENSIVE METABOLIC PANEL WITH GFR - Abnormal; Notable for the following components:      Result Value   Sodium 133 (*)    Glucose, Bld 128 (*)    Alkaline Phosphatase 37 (*)    Total Bilirubin 1.4 (*)    All other components within normal limits  I-STAT CHEM 8, ED - Abnormal; Notable for the following components:   Sodium 134 (*)    Glucose, Bld 124 (*)    Calcium , Ion 1.11 (*)    All other components within normal limits  ETHANOL  PROTIME-INR  APTT  CBC  DIFFERENTIAL  RAPID URINE DRUG SCREEN, HOSP PERFORMED    EKG: EKG Interpretation Date/Time:  Sunday August 15 2023 06:08:17 EDT Ventricular Rate:  65 PR Interval:  211 QRS Duration:  100 QT Interval:  397 QTC Calculation: 413 R Axis:   -5  Text  Interpretation: Sinus rhythm RSR' in V1 or V2, right VCD or RVH Confirmed by Donita Furrow ([id number]) on 08/15/2023 6:25:14 AM  Radiology: No results found.   Procedures   Medications Ordered in the ED  ondansetron [facility]) injection 4 mg (4 mg Intravenous Given 08/15/23 0623)    Clinical Course as of 08/15/23 0713  Sun Aug 15, 2023  0630 Dr. Bonnita Buttner recommends MRI.  If positive for stroke would then subsequently get CTA imaging. [CH]    Clinical Course User Index [CH] Lilley Hubble, Vonzella Guernsey, MD                                 Medical Decision Making Amount and/or Complexity of Data Reviewed Labs: ordered. Radiology: ordered.  Risk Prescription drug management.   This patient presents to the ED for concern of dizziness, this involves an extensive number of treatment options, and is a complaint that carries with it a high risk of complications and morbidity.  I considered the following differential and admission for this acute, potentially life threatening condition.  The differential diagnosis includes peripheral vertigo, central vertigo, dehydration  MDM:    This is a an 83 year old male who presents with episodes of dizziness.  He is currently asymptomatic.  He is nontoxic-appearing.  Neurologic exam is reassuring.  There are some aspects of his history that are suggestive of peripheral vertigo including worse with head movement.  Patient given Zofran.  Briefly discussed with neurology who recommends MRI.  Lab workup is largely reassuring.  MRI pending.  (Labs, imaging, consults)  Labs: I Ordered, and personally interpreted labs.  The pertinent results include: Stroke panel labs  Imaging Studies ordered: I ordered imaging studies including MRI I independently visualized and interpreted imaging. I agree with the radiologist interpretation  Additional history obtained from family at bedside.  External records from outside source obtained and reviewed including prior  evaluations  Cardiac Monitoring: The patient was maintained on a cardiac monitor.  If on the cardiac monitor, I personally viewed and interpreted the cardiac monitored which showed an underlying rhythm of:' Sinus  Reevaluation: After the interventions noted above, I reevaluated the patient and found that they have :stayed the same  Social Determinants of Health:  lives independently  Disposition: Pending MRI  Co morbidities that complicate the patient evaluation  Past Medical History:  Diagnosis Date   Hyperlipidemia    Hypertension    Malignant melanoma (HCC)      Medicines Meds ordered this  encounter  Medications   ondansetron (ZOFRAN) injection 4 mg    I have reviewed the patients home medicines and have made adjustments as needed  Problem List / ED Course: Problem List Items Addressed This Visit   None      ,dm     Final diagnoses:  None    ED Discharge Orders     None          Brandon Collard, MD 08/15/23 [phone]

## 2023-08-15 NOTE — ED Triage Notes (Signed)
 Patient coming from home with concerns for possible stroke. Patient started having dizzy spells 1300 yesterday afternoon. Spells last around 5 seconds. Never diagnosed with vertigo. Patient fell and hit right side of head on the wall. No LOC, no thinners. EMS stroke screen was negative. Patient was ambulatory in room. EMS VS 166/70 BP 70 HR 106 CBG 99% RA 12 lead unremarkable

## 2023-08-15 NOTE — ED Notes (Signed)
 This RN came back and was notified patient was in MRI.

## 2023-08-15 NOTE — ED Notes (Addendum)
 This RN flushed patients ear, no cerumen came out. MD notified.

## 2023-08-15 NOTE — ED Provider Notes (Signed)
 Assumed care of patient from off-going team. For more details, please see note from same day.  In brief, this is a 83 y.o. male with an episode of vertigo, no history of similar  Plan/Dispo at time of sign-out & ED Course since sign-out: Brain MRI  BP 134/69   Pulse 61   Temp 99 F (37.2 C) (Oral)   Resp 16   SpO2 100%   Labs reassuring  ED Course:   Clinical Course as of 08/15/23 1345  Sun Aug 15, 2023  0630 Dr. Bonnita Buttner recommends MRI.  If positive for stroke would then subsequently get CTA imaging. [CH]  0959 MR BRAIN WO CONTRAST 1. No acute intracranial abnormality related to the reported neuro deficit and suspected stroke. Normal MRI of the brain for age.   [HN]  1033 Patient with no symptoms at this time. Relieved of neg MRI. Discussed possible causes and performed ear exam - R TM normal. L TM with cerumen impaction. Patient states he has recurrent cerumen impactions in L ear, last time was cleaned out a year ago. Could be contributing to his vertigo though he has had cerumen impactions before but never vertigo. Discussed adding meclizine as well. [HN]  1344 Attempted removal of cerumen impaction w/ RN and warm water flushes/colace.  After repeated attempts there is some wax that is removed but not all.  Patient states that he would like to be discharged now.  Patient denies any current symptoms stated that the warm water flushes were also making him very dizzy.  I encourage patient to follow-up with his primary care physician and to take meclizine as needed at home, stay well-hydrated, and take it easy when sitting and standing.  I also put in a referral to vestibular physical therapy and discussed this with the patient as he could have BPPV.  Patient is given discharge instructions and return precautions, all questions answered to patient satisfaction. [HN]    Clinical Course User Index [CH] Horton, Vonzella Guernsey, MD [HN] Merdis Stalling, MD    Dispo:  DC ------------------------------- Annita Kindle, MD Emergency Medicine  This note was created using dictation software, which may contain spelling or grammatical errors.   Merdis Stalling, MD 08/15/23 1345

## 2023-08-15 NOTE — Discharge Instructions (Addendum)
 Thank you for coming to [facility] Emergency Department. You were seen for dizziness. 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 We have prescribed meclizine 25 mg every 6 hours as needed for dizziness and placed a referral to vestibular PT who can help. They will reach out to you in a few days to schedule an appointment. MRI did not show any signs of stroke, and labs were reassuring.  Please follow up with your primary care provider within 1 week.   Do not hesitate to return to the ED or call 911 if you experience: -Worsening symptoms -Falls, head trauma -Lightheadedness, passing out -Fevers/chills -Anything else that concerns you

## 2023-08-15 NOTE — ED Notes (Signed)
 This nurse called [facility]CMD to have patient monitored.

## 2023-08-18 ENCOUNTER — Ambulatory Visit: Payer: Medicare Other | Admitting: Internal Medicine

## 2023-08-18 ENCOUNTER — Inpatient Hospital Stay (HOSPITAL_BASED_OUTPATIENT_CLINIC_OR_DEPARTMENT_OTHER): Admitting: Physician Assistant

## 2023-08-18 ENCOUNTER — Inpatient Hospital Stay: Payer: Medicare Other | Attending: Physician Assistant

## 2023-08-18 VITALS — HR 70 | Temp 98.6°F | Resp 16 | Wt 171.3 lb

## 2023-08-18 DIAGNOSIS — R42 Dizziness and giddiness: Secondary | ICD-10-CM | POA: Diagnosis not present

## 2023-08-18 DIAGNOSIS — C439 Malignant melanoma of skin, unspecified: Secondary | ICD-10-CM | POA: Diagnosis not present

## 2023-08-18 DIAGNOSIS — Z85820 Personal history of malignant melanoma of skin: Secondary | ICD-10-CM | POA: Diagnosis present

## 2023-08-18 LAB — CBC WITH DIFFERENTIAL (CANCER CENTER ONLY)
Abs Immature Granulocytes: 0.01 10*3/uL (ref 0.00–0.07)
Basophils Absolute: 0.1 10*3/uL (ref 0.0–0.1)
Basophils Relative: 1 %
Eosinophils Absolute: 0 10*3/uL (ref 0.0–0.5)
Eosinophils Relative: 0 %
HCT: 39 % (ref 39.0–52.0)
Hemoglobin: 14 g/dL (ref 13.0–17.0)
Immature Granulocytes: 0 %
Lymphocytes Relative: 24 %
Lymphs Abs: 1.4 10*3/uL (ref 0.7–4.0)
MCH: 31.7 pg (ref 26.0–34.0)
MCHC: 35.9 g/dL (ref 30.0–36.0)
MCV: 88.4 fL (ref 80.0–100.0)
Monocytes Absolute: 0.4 10*3/uL (ref 0.1–1.0)
Monocytes Relative: 8 %
Neutro Abs: 4 10*3/uL (ref 1.7–7.7)
Neutrophils Relative %: 67 %
Platelet Count: 235 10*3/uL (ref 150–400)
RBC: 4.41 MIL/uL (ref 4.22–5.81)
RDW: 11.9 % (ref 11.5–15.5)
WBC Count: 5.9 10*3/uL (ref 4.0–10.5)
nRBC: 0 % (ref 0.0–0.2)

## 2023-08-18 LAB — CMP (CANCER CENTER ONLY)
ALT: 20 U/L (ref 0–44)
AST: 18 U/L (ref 15–41)
Albumin: 4.4 g/dL (ref 3.5–5.0)
Alkaline Phosphatase: 44 U/L (ref 38–126)
Anion gap: 6 (ref 5–15)
BUN: 20 mg/dL (ref 8–23)
CO2: 26 mmol/L (ref 22–32)
Calcium: 9.4 mg/dL (ref 8.9–10.3)
Chloride: 104 mmol/L (ref 98–111)
Creatinine: 0.89 mg/dL (ref 0.61–1.24)
GFR, Estimated: >60 mL/min (ref >60)
Glucose, Bld: 100 mg/dL — ABNORMAL HIGH (ref 70–99)
Potassium: 4.2 mmol/L (ref 3.5–5.1)
Sodium: 136 mmol/L (ref 135–145)
Total Bilirubin: 0.9 mg/dL (ref 0.0–1.2)
Total Protein: 7.2 g/dL (ref 6.5–8.1)

## 2023-08-18 LAB — LACTATE DEHYDROGENASE: LDH: 126 U/L (ref 98–192)

## 2024-08-17 ENCOUNTER — Other Ambulatory Visit

## 2024-08-17 ENCOUNTER — Ambulatory Visit: Admitting: Physician Assistant
